# Patient Record
Sex: Male | Born: 1970 | Race: White | Hispanic: No | Marital: Married | Smoking: Current every day smoker
Health system: Southern US, Community
[De-identification: ages and names within clinical notes are randomized; demographics above are authoritative.]

## PROBLEM LIST (undated history)

## (undated) DIAGNOSIS — J449 Chronic obstructive pulmonary disease, unspecified: Secondary | ICD-10-CM

## (undated) DIAGNOSIS — R200 Anesthesia of skin: Secondary | ICD-10-CM

## (undated) DIAGNOSIS — E291 Testicular hypofunction: Secondary | ICD-10-CM

## (undated) DIAGNOSIS — M436 Torticollis: Secondary | ICD-10-CM

## (undated) DIAGNOSIS — M1990 Unspecified osteoarthritis, unspecified site: Secondary | ICD-10-CM

## (undated) DIAGNOSIS — Z974 Presence of external hearing-aid: Secondary | ICD-10-CM

## (undated) DIAGNOSIS — Z720 Tobacco use: Secondary | ICD-10-CM

## (undated) HISTORY — PX: BACK SURGERY: SHX140

## (undated) HISTORY — DX: Tobacco use: Z72.0

## (undated) HISTORY — DX: Testicular hypofunction: E29.1

## (undated) HISTORY — PX: SHOULDER SURGERY: SHX246

---

## 1998-01-24 HISTORY — PX: BACK SURGERY: SHX140

## 2004-06-02 ENCOUNTER — Other Ambulatory Visit: Payer: Self-pay

## 2004-06-02 ENCOUNTER — Emergency Department: Payer: Self-pay | Admitting: Emergency Medicine

## 2006-06-29 ENCOUNTER — Ambulatory Visit (HOSPITAL_COMMUNITY): Admission: RE | Admit: 2006-06-29 | Discharge: 2006-06-29 | Payer: Self-pay | Admitting: Specialist

## 2007-09-23 ENCOUNTER — Emergency Department: Payer: Self-pay | Admitting: Emergency Medicine

## 2008-09-05 ENCOUNTER — Ambulatory Visit (HOSPITAL_COMMUNITY): Admission: RE | Admit: 2008-09-05 | Discharge: 2008-09-05 | Payer: Self-pay | Admitting: Specialist

## 2009-12-12 IMAGING — CR DG ORBITS FOR FOREIGN BODY
2 series · 2 of 2 positions shown · non-contrast
Comparison: 06/29/2006

CLINICAL DATA: History of metal exposure to the eyes.  Pre MRI.

ORBITS FOR FOREIGN BODY - 2 VIEW

[w waters * (1 of 2)]
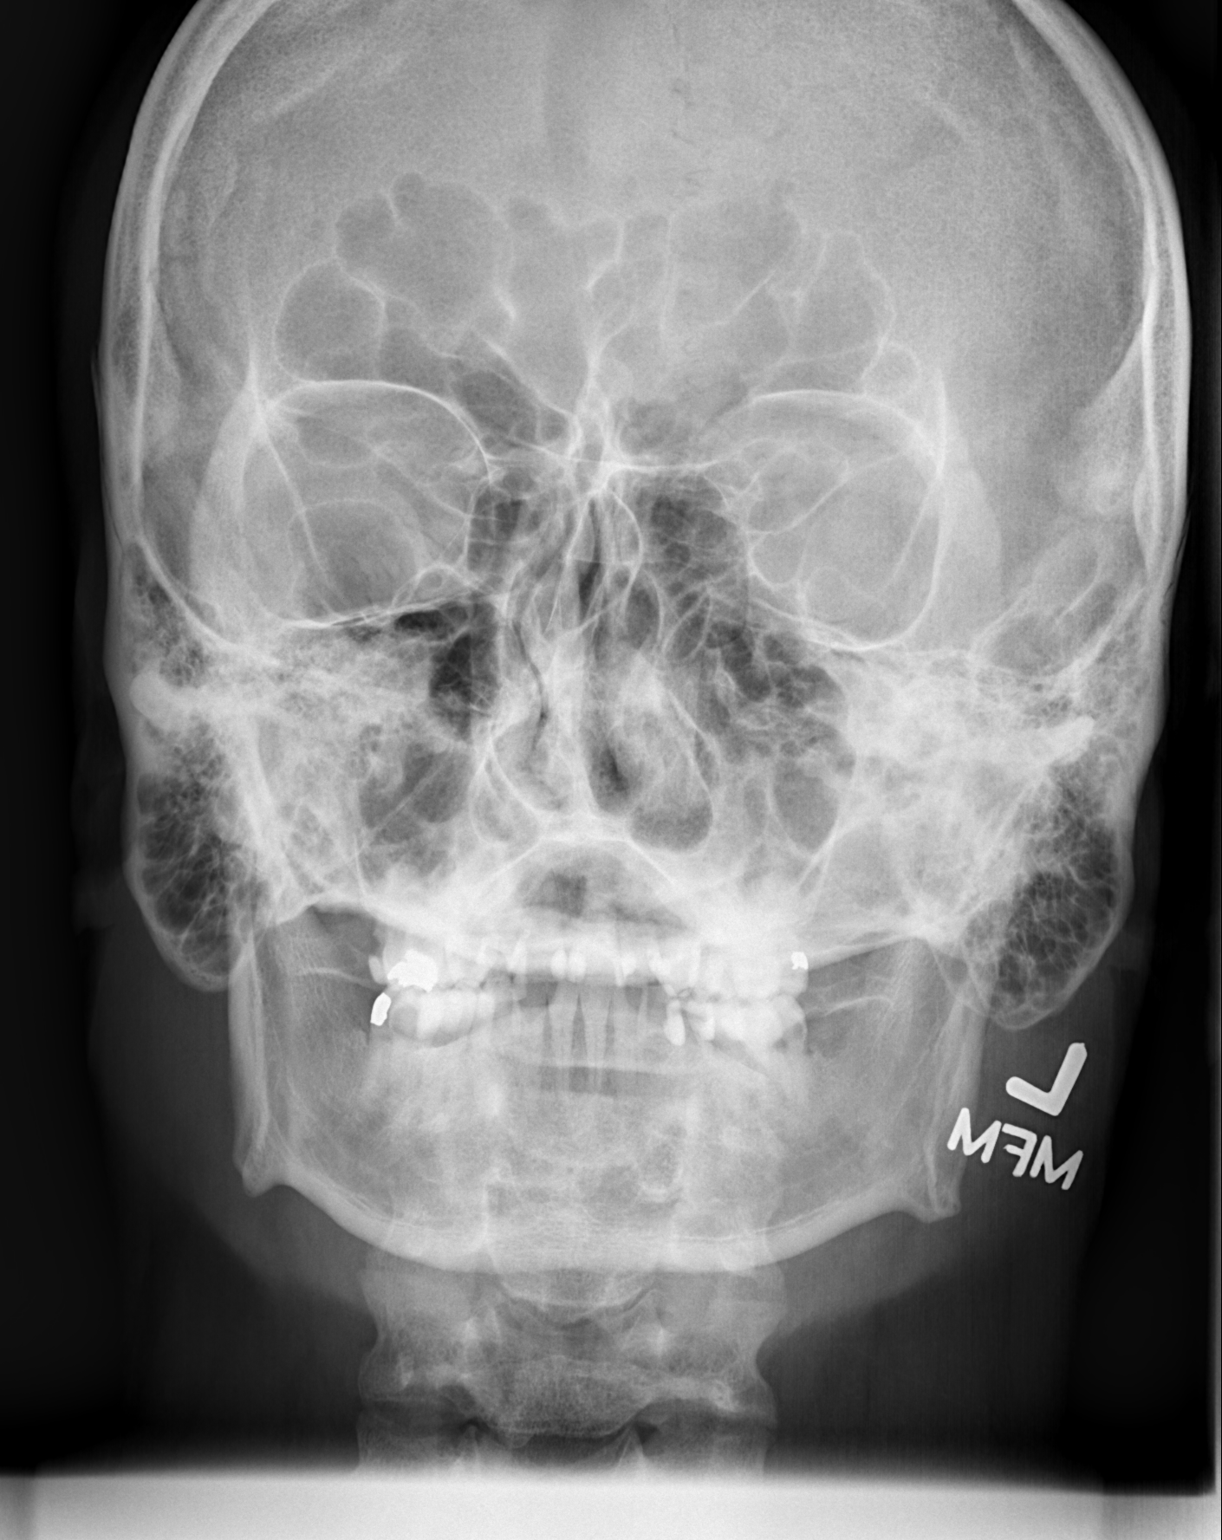

[w waters * (2 of 2)]
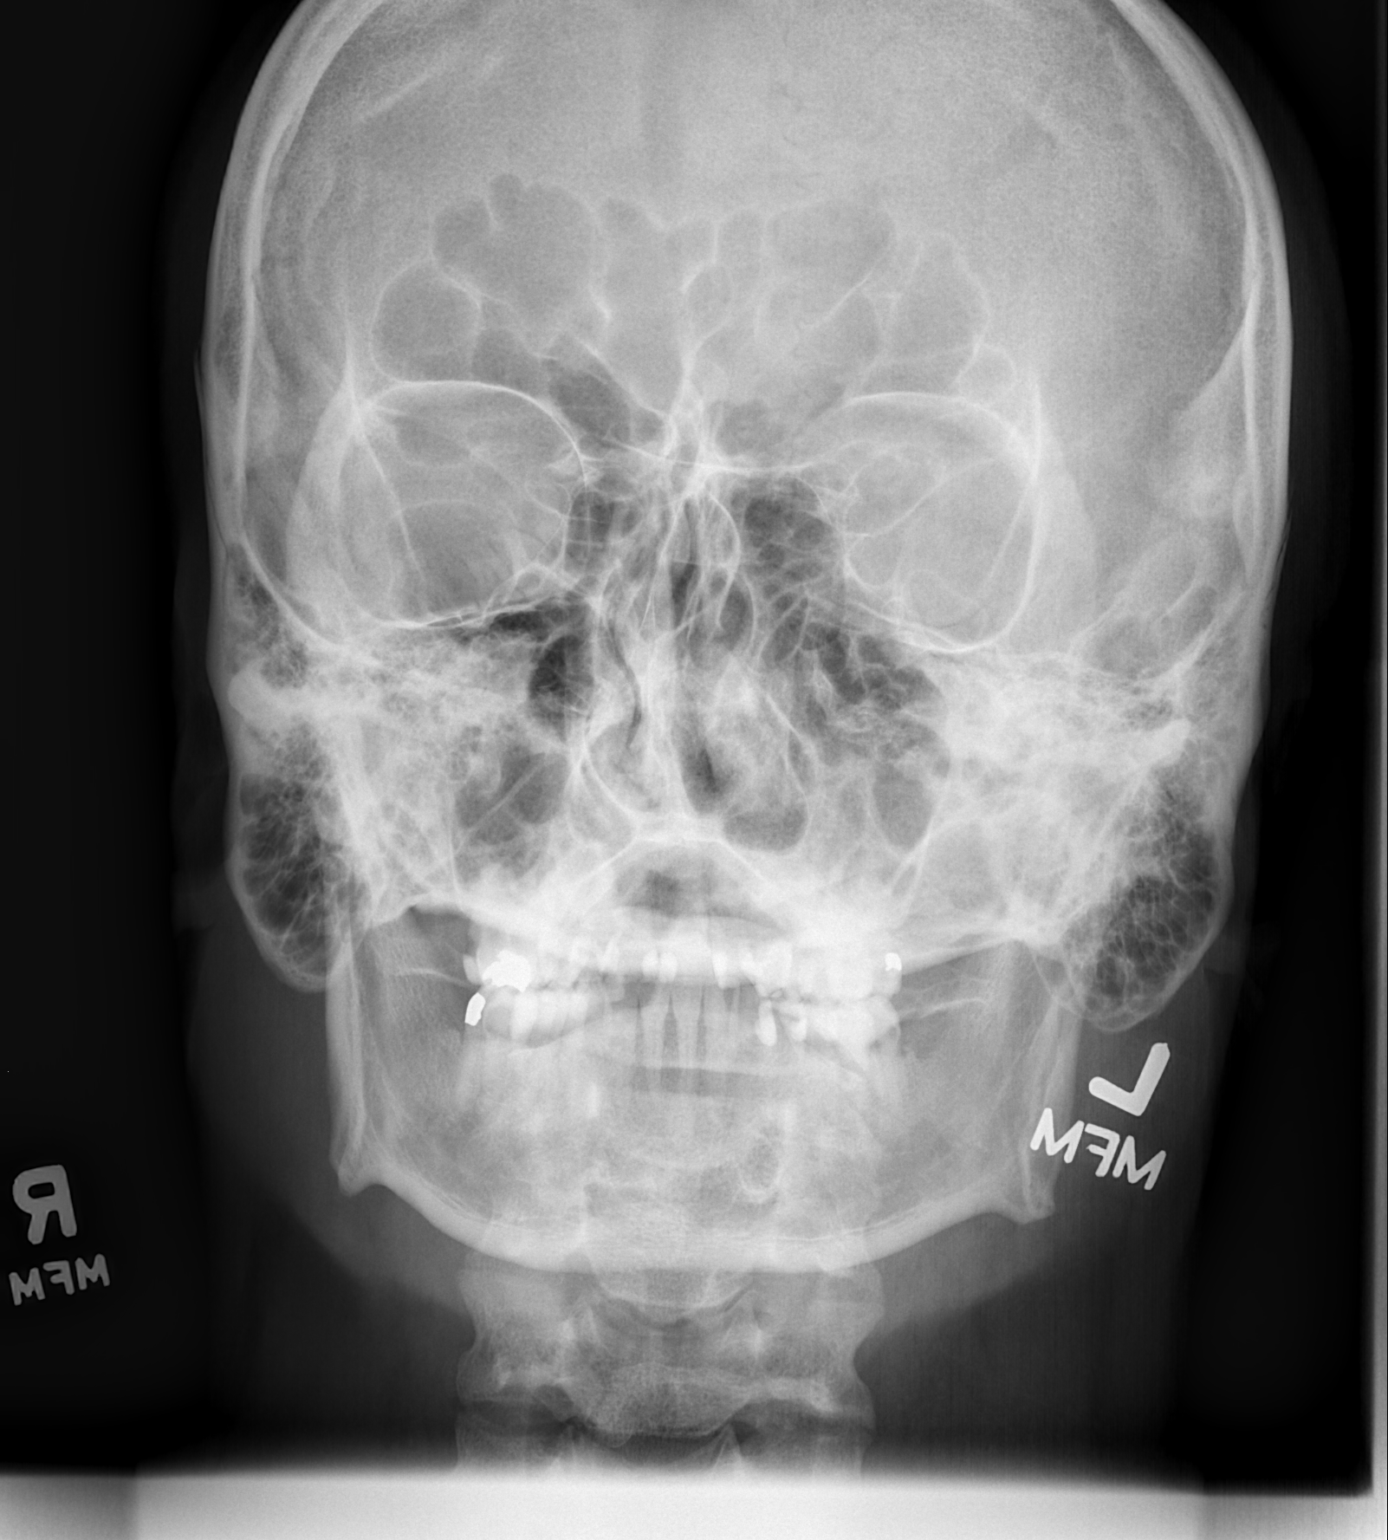

[2 of 2 positions shown; findings below may reference images not displayed]

FINDINGS: There is no radiodense foreign body in or around the
orbits.  The visualized bony structures are normal.
IMPRESSION: Normal exam.  No evidence of a metallic foreign body around the
orbits.

## 2014-07-11 ENCOUNTER — Ambulatory Visit (INDEPENDENT_AMBULATORY_CARE_PROVIDER_SITE_OTHER): Payer: 59 | Admitting: Family Medicine

## 2014-07-11 ENCOUNTER — Encounter: Payer: Self-pay | Admitting: Family Medicine

## 2014-07-11 VITALS — BP 123/87 | HR 89 | Temp 97.6°F | Wt 237.0 lb

## 2014-07-11 DIAGNOSIS — Z720 Tobacco use: Secondary | ICD-10-CM

## 2014-07-11 DIAGNOSIS — J40 Bronchitis, not specified as acute or chronic: Secondary | ICD-10-CM

## 2014-07-11 DIAGNOSIS — E291 Testicular hypofunction: Secondary | ICD-10-CM | POA: Insufficient documentation

## 2014-07-11 MED ORDER — ALBUTEROL SULFATE HFA 108 (90 BASE) MCG/ACT IN AERS: 2.0000 | INHALATION_SPRAY | RESPIRATORY_TRACT | Status: DC | PRN

## 2014-07-11 MED ORDER — DOXYCYCLINE HYCLATE 100 MG PO TABS: 100.0000 mg | ORAL_TABLET | Freq: Two times a day (BID) | ORAL | Status: DC

## 2014-07-11 MED ORDER — BENZONATATE 100 MG PO CAPS: 100.0000 mg | ORAL_CAPSULE | Freq: Three times a day (TID) | ORAL | Status: DC | PRN

## 2014-07-11 NOTE — Patient Instructions (Addendum)
Try vitamin C (orange juice if not diabetic or vitamin C tablets) and drink green tea to help your immune system during your illness Get plenty of rest and hydration Please do take a probiotic or eat yogurt daily for the next month If you develop watery, foul diarrhea in the next two months, schedule an appointment here right away to be evaluated Your goal blood pressure is less than 140/90 Try to follow the DASH guidelines (DASH stands for Dietary Approaches to Stop Hypertension) Try to limit the sodium in your diet.  Ideally, consume less than 1.5 grams (less than 1,500mg ) per day. Do not add salt when cooking or at the table.  Check the sodium amount on labels when shopping, and choose items lower in sodium when given a choice. Avoid or limit foods that already contain a lot of sodium. Eat a diet rich in fruits and vegetables and whole grains. Do try to quit smoking; it will be the BEST thing you do for your health and your wallet 1-800-QUIT-NOW has free counseling   DASH Eating Plan DASH stands for "Dietary Approaches to Stop Hypertension." The DASH eating plan is a healthy eating plan that has been shown to reduce high blood pressure (hypertension). Additional health benefits may include reducing the risk of type 2 diabetes mellitus, heart disease, and stroke. The DASH eating plan may also help with weight loss. WHAT DO I NEED TO KNOW ABOUT THE DASH EATING PLAN? For the DASH eating plan, you will follow these general guidelines:  Choose foods with a percent daily value for sodium of less than 5% (as listed on the food label).  Use salt-free seasonings or herbs instead of table salt or sea salt.  Check with your health care provider or pharmacist before using salt substitutes.  Eat lower-sodium products, often labeled as "lower sodium" or "no salt added."  Eat fresh foods.  Eat more vegetables, fruits, and low-fat dairy products.  Choose whole grains. Look for the word "whole" as the  first word in the ingredient list.  Choose fish and skinless chicken or [place] more often than red meat. Limit fish, poultry, and meat to 6 oz (170 g) each day.  Limit sweets, desserts, sugars, and sugary drinks.  Choose heart-healthy fats.  Limit cheese to 1 oz (28 g) per day.  Eat more home-cooked food and less restaurant, buffet, and fast food.  Limit fried foods.  Cook foods using methods other than frying.  Limit canned vegetables. If you do use them, rinse them well to decrease the sodium.  When eating at a restaurant, ask that your food be prepared with less salt, or no salt if possible. WHAT FOODS CAN I EAT? Seek help from a dietitian for individual calorie needs. Grains Whole grain or whole wheat bread. Brown rice. Whole grain or whole wheat pasta. Quinoa, bulgur, and whole grain cereals. Low-sodium cereals. Corn or whole wheat flour tortillas. Whole grain cornbread. Whole grain crackers. Low-sodium crackers. Vegetables Fresh or frozen vegetables (raw, steamed, roasted, or grilled). Low-sodium or reduced-sodium tomato and vegetable juices. Low-sodium or reduced-sodium tomato sauce and paste. Low-sodium or reduced-sodium canned vegetables.  Fruits All fresh, canned (in natural juice), or frozen fruits. Meat and Other Protein Products Ground beef (85% or leaner), grass-fed beef, or beef trimmed of fat. Skinless chicken or [place]. Ground chicken or [place]. Pork trimmed of fat. All fish and seafood. Eggs. Dried beans, peas, or lentils. Unsalted nuts and seeds. Unsalted canned beans. Dairy Low-fat dairy products, such as  skim or 1% milk, 2% or reduced-fat cheeses, low-fat ricotta or cottage cheese, or plain low-fat yogurt. Low-sodium or reduced-sodium cheeses. Fats and Oils Tub margarines without trans fats. Light or reduced-fat mayonnaise and salad dressings (reduced sodium). Avocado. Safflower, olive, or canola oils. Natural peanut or almond butter. Other Unsalted popcorn and  pretzels. The items listed above may not be a complete list of recommended foods or beverages. Contact your dietitian for more options. WHAT FOODS ARE NOT RECOMMENDED? Grains White bread. White pasta. White rice. Refined cornbread. Bagels and croissants. Crackers that contain trans fat. Vegetables Creamed or fried vegetables. Vegetables in a cheese sauce. Regular canned vegetables. Regular canned tomato sauce and paste. Regular tomato and vegetable juices. Fruits Dried fruits. Canned fruit in light or heavy syrup. Fruit juice. Meat and Other Protein Products Fatty cuts of meat. Ribs, chicken wings, bacon, sausage, bologna, salami, chitterlings, fatback, hot dogs, bratwurst, and packaged luncheon meats. Salted nuts and seeds. Canned beans with salt. Dairy Whole or 2% milk, cream, half-and-half, and cream cheese. Whole-fat or sweetened yogurt. Full-fat cheeses or blue cheese. Nondairy creamers and whipped toppings. Processed cheese, cheese spreads, or cheese curds. Condiments Onion and garlic salt, seasoned salt, table salt, and sea salt. Canned and packaged gravies. Worcestershire sauce. Tartar sauce. Barbecue sauce. Teriyaki sauce. Soy sauce, including reduced sodium. Steak sauce. Fish sauce. Oyster sauce. Cocktail sauce. Horseradish. Ketchup and mustard. Meat flavorings and tenderizers. Bouillon cubes. Hot sauce. Tabasco sauce. Marinades. Taco seasonings. Relishes. Fats and Oils Butter, stick margarine, lard, shortening, ghee, and bacon fat. Coconut, palm kernel, or palm oils. Regular salad dressings. Other Pickles and olives. Salted popcorn and pretzels. The items listed above may not be a complete list of foods and beverages to avoid. Contact your dietitian for more information. WHERE CAN I FIND MORE INFORMATION? National [employer], [employer], and [employer] Institute: [employer] Document Released: 12/30/2010 Document Revised: 05/27/2013 Document Reviewed:  11/14/2012 [facility] Patient Information 2015 [place], [place]. This information is not intended to replace advice given to you by your health care provider. Make sure you discuss any questions you have with your health care provider.   Nicotine Addiction Nicotine can act as both a stimulant (excites/activates) and a sedative (calms/quiets). Immediately after exposure to nicotine, there is a "kick" caused in part by the drug's stimulation of the adrenal glands and resulting discharge of adrenaline (epinephrine). The rush of adrenaline stimulates the body and causes a sudden release of sugar. This means that smokers are always slightly hyperglycemic. Hyperglycemic means that the blood sugar is high, just like in diabetics. Nicotine also decreases the amount of insulin which helps control sugar levels in the body. There is an increase in blood pressure, breathing, and the rate of heart beats.  In addition, nicotine indirectly causes a release of dopamine in the brain that controls pleasure and motivation. A similar reaction is seen with other drugs of abuse, such as cocaine and heroin. This dopamine release is thought to cause the pleasurable sensations when smoking. In some different cases, nicotine can also create a calming effect, depending on sensitivity of the smoker's nervous system and the dose of nicotine taken. WHAT HAPPENS WHEN NICOTINE IS TAKEN FOR LONG PERIODS OF TIME?  Long-term use of nicotine results in addiction. It is difficult to stop.  Repeated use of nicotine creates tolerance. Higher doses of nicotine are needed to get the "kick." When nicotine use is stopped, withdrawal may last a month or more. Withdrawal may begin within a few hours after the last  cigarette. Symptoms peak within the first few days and may lessen within a few weeks. For some people, however, symptoms may last for months or longer. Withdrawal symptoms include:   Irritability.  Craving.  Learning and attention  deficits.  Sleep disturbances.  Increased appetite. Craving for tobacco may last for 6 months or longer. Many behaviors done while using nicotine can also play a part in the severity of withdrawal symptoms. For some people, the feel, smell, and sight of a cigarette and the ritual of obtaining, handling, lighting, and smoking the cigarette are closely linked with the pleasure of smoking. When stopped, they also miss the related behaviors which make the withdrawal or craving worse. While nicotine gum and patches may lessen the drug aspects of withdrawal, cravings often persist. WHAT ARE THE MEDICAL CONSEQUENCES OF NICOTINE USE?  Nicotine addiction accounts for one-third of all cancers. The top cancer caused by tobacco is lung cancer. Lung cancer is the number one cancer killer of both men and women.  Smoking is also associated with cancers of the:  Mouth.  Pharynx.  Larynx.  Esophagus.  Stomach.  Pancreas.  Cervix.  Kidney.  Ureter.  Bladder.  Smoking also causes lung diseases such as lasting (chronic) bronchitis and emphysema.  It worsens asthma in adults and children.  Smoking increases the risk of heart disease, including:  Stroke.  Heart attack.  Vascular disease.  Aneurysm.  Passive or secondary smoke can also increase medical risks including:  Asthma in children.  Sudden Infant Death Syndrome (SIDS).  Additionally, dropped cigarettes are the leading cause of residential fire fatalities.  Nicotine poisoning has been reported from accidental ingestion of tobacco products by children and pets. Death usually results in a few minutes from respiratory failure (when a person stops breathing) caused by paralysis. TREATMENT   Medication. Nicotine replacement medicines such as nicotine gum and the patch are used to stop smoking. These medicines gradually lower the dosage of nicotine in the body. These medicines do not contain the carbon monoxide and other toxins  found in tobacco smoke.  Hypnotherapy.  Relaxation therapy.  Nicotine Anonymous (a 12-step support program). Find times and locations in your local yellow pages. Document Released: 09/16/2003 Document Revised: 04/04/2011 Document Reviewed: 03/08/2013 [facility] Patient Information 2015 [place], [place]. This information is not intended to replace advice given to you by your health care provider. Make sure you discuss any questions you have with your health care provider.

## 2014-07-11 NOTE — Assessment & Plan Note (Signed)
Encouraged cessation; discussed potential risks of vaping / e-cigs, bronchiolitis obliterans; don't just substitute one for the other; other options such as pills, education and QUIT-NOW number given; he has tried wellbutrin and did not like how that made him feel

## 2014-07-11 NOTE — Progress Notes (Signed)
BP 123/87 mmHg  Pulse 89  Temp(Src) 97.6 F (36.4 C)  Wt 237 lb (107.502 kg)  SpO2 95%   Subjective:    Patient ID: Luis Buck, male    DOB: 06/26/1970, 44 y.o.   MRN: [id number]  HPI: Luis Buck is a 44 y.o. male  Chief Complaint  Patient presents with  . URI   He started to have allergic symptoms, runny nose on Sun or Monday, then it developed into cough and into the chest Sinuses were bothering her some; ran through two rolls of toilet paper; some bloody drainage; little bit of sore throat; no big glands in his neck; no fevers, some chills; normally hot person Bringing up some phlegm, not much; feels different the stuff that comes up Nyquil and Dyquil He has a hx of bronchitis and pneumonia  He is a smoker; 1 to 1.5 ppd He gave up sodas; he has lost a little bit of weight; he knows he has more to go He is pleased with how his BP has come down  Relevant past medical, surgical, family and social history reviewed and updated as indicated. Interim medical history since our last visit reviewed. Allergies and medications reviewed and updated.  Review of Systems Per HPI unless specifically indicated above     Objective:    BP 123/87 mmHg  Pulse 89  Temp(Src) 97.6 F (36.4 C)  Wt 237 lb (107.502 kg)  SpO2 95%  Wt Readings from Last 3 Encounters:  07/11/14 237 lb (107.502 kg)  02/28/14 245 lb (111.131 kg)    Physical Exam  Constitutional: He appears well-developed and well-nourished. No distress.  HENT:  Head: Normocephalic and atraumatic.  Right Ear: Tympanic membrane, external ear and ear canal normal. Tympanic membrane is not erythematous.  Left Ear: Tympanic membrane, external ear and ear canal normal. Tympanic membrane is not erythematous.  Nose: No rhinorrhea. No epistaxis. Right sinus exhibits no maxillary sinus tenderness and no frontal sinus tenderness. Left sinus exhibits no maxillary sinus tenderness and no frontal sinus tenderness.   Mouth/Throat: No oropharyngeal exudate.  Eyes: Right eye exhibits no discharge. Left eye exhibits no discharge. Right conjunctiva is not injected. Left conjunctiva is not injected. No scleral icterus.  Cardiovascular: Normal rate and regular rhythm.   Pulmonary/Chest: No accessory muscle usage. No tachypnea. No respiratory distress. He has wheezes (faint expiratory wheezes). He has no rhonchi. He has no rales.  Lymphadenopathy:       Head (right side): No submandibular adenopathy present.       Head (left side): No submandibular adenopathy present.    He has no cervical adenopathy.  Skin: No rash noted. He is not diaphoretic. No cyanosis. No pallor. Nails show no clubbing.  Psychiatric: His speech is normal and behavior is normal. Judgment and thought content normal. His mood appears not anxious. He does not exhibit a depressed mood.   No results found for this or any previous visit.    Assessment & Plan:   Problem List Items Addressed This Visit      Other   Tobacco abuse    Encouraged cessation; discussed potential risks of vaping / e-cigs, bronchiolitis obliterans; don't just substitute one for the other; other options such as pills, education and QUIT-NOW number given; he has tried wellbutrin and did not like how that made him feel       Other Visit Diagnoses    Bronchitis    -  Primary    acute bronchitis, suspect  bacterial cause, smoker; start doxy; will also give SABA to use PRN and tessalon perles PRN       DASH guidelines given Encouraged him for giving up sodas Return for physical  Follow up plan: Return if symptoms worsen or fail to improve.

## 2014-07-17 ENCOUNTER — Encounter: Payer: Self-pay | Admitting: Family Medicine

## 2014-07-17 ENCOUNTER — Ambulatory Visit (INDEPENDENT_AMBULATORY_CARE_PROVIDER_SITE_OTHER): Payer: 59 | Admitting: Family Medicine

## 2014-07-17 VITALS — BP 125/85 | HR 78 | Temp 98.4°F | Ht 70.0 in | Wt 237.8 lb

## 2014-07-17 DIAGNOSIS — Z23 Encounter for immunization: Secondary | ICD-10-CM | POA: Diagnosis not present

## 2014-07-17 DIAGNOSIS — N5201 Erectile dysfunction due to arterial insufficiency: Secondary | ICD-10-CM

## 2014-07-17 DIAGNOSIS — Z0000 Encounter for general adult medical examination without abnormal findings: Secondary | ICD-10-CM | POA: Diagnosis not present

## 2014-07-17 DIAGNOSIS — Z720 Tobacco use: Secondary | ICD-10-CM

## 2014-07-17 DIAGNOSIS — R351 Nocturia: Secondary | ICD-10-CM | POA: Diagnosis not present

## 2014-07-17 LAB — URINALYSIS, ROUTINE W REFLEX MICROSCOPIC
BILIRUBIN UA: NEGATIVE
GLUCOSE, UA: NEGATIVE
LEUKOCYTES UA: NEGATIVE
Nitrite, UA: NEGATIVE
PROTEIN UA: NEGATIVE
RBC UA: NEGATIVE
SPEC GRAV UA: 1.025 (ref 1.005–1.030)
Urobilinogen, Ur: 0.2 mg/dL (ref 0.2–1.0)
pH, UA: 6 (ref 5.0–7.5)

## 2014-07-17 MED ORDER — SILDENAFIL CITRATE 50 MG PO TABS: 50.0000 mg | ORAL_TABLET | Freq: Every day | ORAL | Status: DC | PRN

## 2014-07-17 NOTE — Progress Notes (Signed)
BP 125/85 mmHg  Pulse 78  Temp(Src) 98.4 F (36.9 C) (Oral)  Ht 5\' 10"  (1.778 m)  Wt 237 lb 12.8 oz (107.865 kg)  BMI 34.12 kg/m2  SpO2 95%   Subjective:    Patient ID: Luis Buck, male    DOB: 02/19/1970, 44 y.o.   MRN: [id number]  HPI: Luis Buck is a 44 y.o. male  Chief Complaint  Patient presents with  . Annual Exam    Tired.   He is here for his complete physical He doesn't have the energy he used to have any more The sexual drive is still there and very healthy He has lost some muscle bulk; some diminished muscle strength; no change in hair, no change in voice Vision is getting worse, should be wear glasses  Cough is better Smoking 1 ppd; he is interested in quitting; he tried Wellbutrin, but it made him feel weird; did not like how he felt; Chantix gave him no success, did nothing  Relevant past medical, surgical, family and social history reviewed and updated as indicated. Interim medical history since our last visit reviewed. Allergies and medications reviewed and updated.  Review of Systems  Constitutional: Positive for fatigue.  Genitourinary: Positive for urgency. Negative for dysuria, hematuria, decreased urine volume, discharge, penile swelling, scrotal swelling, enuresis, difficulty urinating, penile pain and testicular pain.       Nocturia x 2   Per HPI unless specifically indicated above    Objective:    BP 125/85 mmHg  Pulse 78  Temp(Src) 98.4 F (36.9 C) (Oral)  Ht 5\' 10"  (1.778 m)  Wt 237 lb 12.8 oz (107.865 kg)  BMI 34.12 kg/m2  SpO2 95%  Wt Readings from Last 3 Encounters:  07/17/14 237 lb 12.8 oz (107.865 kg)  07/11/14 237 lb (107.502 kg)  02/28/14 245 lb (111.131 kg)    Physical Exam  Constitutional: He appears well-developed and well-nourished. No distress.  HENT:  Head: Normocephalic and atraumatic.  Nose: No rhinorrhea.  Mouth/Throat: Mucous membranes are normal. No oropharyngeal exudate.  Eyes: EOM are normal. No  scleral icterus.  Neck: No JVD present. No thyromegaly present.  Cardiovascular: Normal rate, regular rhythm and normal heart sounds.   No murmur heard. Pulmonary/Chest: Effort normal and breath sounds normal. No respiratory distress. He has no wheezes.  Abdominal: Soft. Bowel sounds are normal. He exhibits no distension. There is no tenderness. There is no guarding.  Genitourinary:  Mutual decision making, deferred  Musculoskeletal: He exhibits no edema.  Lymphadenopathy:    He has no cervical adenopathy.  Neurological: He is alert. He displays no tremor. He exhibits normal muscle tone.  Skin: Skin is warm and dry. No rash noted. He is not diaphoretic. No cyanosis.  Psychiatric: He has a normal mood and affect. His behavior is normal. Judgment and thought content normal.  Nursing note and vitals reviewed.      Assessment & Plan:   Problem List Items Addressed This Visit      Other   Tobacco abuse    Stressed importance of quitting smoking; he did not do well with previous meds; QUITNOW; explained link between smoking and ED       Other Visit Diagnoses    Preventative health care    -  Primary    Relevant Orders    Comprehensive metabolic panel (Completed)    CBC with Differential/Platelet (Completed)    Lipid Panel w/o Chol/HDL Ratio (Completed)    PSA, total and free (  Completed)    Erectile dysfunction due to arterial insufficiency        explained more likely due to smoking, vascular problems than testosterone; Rx for viagra; discussed med    Relevant Orders    EKG 12-Lead    Testosterone, free, total (Completed)    Nocturia        check urine, check glucose    Relevant Orders    Urinalysis, Routine w reflex microscopic (not at [facility]) (Completed)    Immunization due        tetanus-diphtheria-pertussis; next plain tetanus-diphtheria due in 10 years; pertussis should provide life-long immunity    Relevant Orders    Tdap vaccine greater than or equal to 7yo IM  (Completed)       Follow up plan: Return in about 1 year (around 07/17/2015) for physical.  Meds ordered this encounter  Medications  . sildenafil (VIAGRA) 50 MG tablet    Sig: Take 1 tablet (50 mg total) by mouth daily as needed for erectile dysfunction.    Dispense:  10 tablet    Refill:  5    An After Visit Summary was printed and given to the patient.

## 2014-07-17 NOTE — Patient Instructions (Addendum)
Smoking Cessation, Tips for Success If you are ready to quit smoking, congratulations! You have chosen to help yourself be healthier. Cigarettes bring nicotine, tar, carbon monoxide, and other irritants into your body. Your lungs, heart, and blood vessels will be able to work better without these poisons. There are many different ways to quit smoking. Nicotine gum, nicotine patches, a nicotine inhaler, or nicotine nasal spray can help with physical craving. Hypnosis, support groups, and medicines help break the habit of smoking. WHAT THINGS CAN I DO TO MAKE QUITTING EASIER?  Here are some tips to help you quit for good:  Pick a date when you will quit smoking completely. Tell all of your friends and family about your plan to quit on that date.  Do not try to slowly cut down on the number of cigarettes you are smoking. Pick a quit date and quit smoking completely starting on that day.  Throw away all cigarettes.   Clean and remove all ashtrays from your home, work, and car.  On a card, write down your reasons for quitting. Carry the card with you and read it when you get the urge to smoke.  Cleanse your body of nicotine. Drink enough water and fluids to keep your urine clear or pale yellow. Do this after quitting to flush the nicotine from your body.  Learn to predict your moods. Do not let a bad situation be your excuse to have a cigarette. Some situations in your life might tempt you into wanting a cigarette.  Never have "just one" cigarette. It leads to wanting another and another. Remind yourself of your decision to quit.  Change habits associated with smoking. If you smoked while driving or when feeling stressed, try other activities to replace smoking. Stand up when drinking your coffee. Brush your teeth after eating. Sit in a different chair when you read the paper. Avoid alcohol while trying to quit, and try to drink fewer caffeinated beverages. Alcohol and caffeine may urge you to  smoke.  Avoid foods and drinks that can trigger a desire to smoke, such as sugary or spicy foods and alcohol.  Ask people who smoke not to smoke around you.  Have something planned to do right after eating or having a cup of coffee. For example, plan to take a walk or exercise.  Try a relaxation exercise to calm you down and decrease your stress. Remember, you may be tense and nervous for the first 2 weeks after you quit, but this will pass.  Find new activities to keep your hands busy. Play with a pen, coin, or rubber band. Doodle or draw things on paper.  Brush your teeth right after eating. This will help cut down on the craving for the taste of tobacco after meals. You can also try mouthwash.   Use oral substitutes in place of cigarettes. Try using lemon drops, carrots, cinnamon sticks, or chewing gum. Keep them handy so they are available when you have the urge to smoke.  When you have the urge to smoke, try deep breathing.  Designate your home as a nonsmoking area.  If you are a heavy smoker, ask your health care provider about a prescription for nicotine chewing gum. It can ease your withdrawal from nicotine.  Reward yourself. Set aside the cigarette money you save and buy yourself something nice.  Look for support from others. Join a support group or smoking cessation program. Ask someone at home or at work to help you with your plan   to quit smoking.  Always ask yourself, "Do I need this cigarette or is this just a reflex?" Tell yourself, "Today, I choose not to smoke," or "I do not want to smoke." You are reminding yourself of your decision to quit.  Do not replace cigarette smoking with electronic cigarettes (commonly called e-cigarettes). The safety of e-cigarettes is unknown, and some may contain harmful chemicals.  If you relapse, do not give up! Plan ahead and think about what you will do the next time you get the urge to smoke. HOW WILL I FEEL WHEN I QUIT SMOKING? You  may have symptoms of withdrawal because your body is used to nicotine (the addictive substance in cigarettes). You may crave cigarettes, be irritable, feel very hungry, cough often, get headaches, or have difficulty concentrating. The withdrawal symptoms are only temporary. They are strongest when you first quit but will go away within 10-14 days. When withdrawal symptoms occur, stay in control. Think about your reasons for quitting. Remind yourself that these are signs that your body is healing and getting used to being without cigarettes. Remember that withdrawal symptoms are easier to treat than the major diseases that smoking can cause.  Even after the withdrawal is over, expect periodic urges to smoke. However, these cravings are generally short lived and will go away whether you smoke or not. Do not smoke! WHAT RESOURCES ARE AVAILABLE TO HELP ME QUIT SMOKING? Your health care provider can direct you to community resources or hospitals for support, which may include:  Group support.  Education.  Hypnosis.  Therapy. Document Released: 10/09/2003 Document Revised: 05/27/2013 Document Reviewed: 06/28/2012 [facility] Patient Information 2015 [place], [place]. This information is not intended to replace advice given to you by your health care provider. Make sure you discuss any questions you have with your health care provider.    Health Maintenance A healthy lifestyle and preventative care can promote health and wellness.  Maintain regular health, dental, and eye exams.  Eat a healthy diet. Foods like vegetables, fruits, whole grains, low-fat dairy products, and lean protein foods contain the nutrients you need and are low in calories. Decrease your intake of foods high in solid fats, added sugars, and salt. Get information about a proper diet from your health care provider, if necessary.  Regular physical exercise is one of the most important things you can do for your health. Most adults  should get at least 150 minutes of moderate-intensity exercise (any activity that increases your heart rate and causes you to sweat) each week. In addition, most adults need muscle-strengthening exercises on 2 or more days a week.   Maintain a healthy weight. The body mass index (BMI) is a screening tool to identify possible weight problems. It provides an estimate of body fat based on height and weight. Your health care provider can find your BMI and can help you achieve or maintain a healthy weight. For males 20 years and older:  A BMI below 18.5 is considered underweight.  A BMI of 18.5 to 24.9 is normal.  A BMI of 25 to 29.9 is considered overweight.  A BMI of 30 and above is considered obese.  Maintain normal blood lipids and cholesterol by exercising and minimizing your intake of saturated fat. Eat a balanced diet with plenty of fruits and vegetables. Blood tests for lipids and cholesterol should begin at age 74 and be repeated every 5 years. If your lipid or cholesterol levels are high, you are over age 33, or you are  at high risk for heart disease, you may need your cholesterol levels checked more frequently.Ongoing high lipid and cholesterol levels should be treated with medicines if diet and exercise are not working.  If you smoke, find out from your health care provider how to quit. If you do not use tobacco, do not start.  Lung cancer screening is recommended for adults aged 55-80 years who are at high risk for developing lung cancer because of a history of smoking. A yearly low-dose CT scan of the lungs is recommended for people who have at least a 30-pack-year history of smoking and are current smokers or have quit within the past 15 years. A pack year of smoking is smoking an average of 1 pack of cigarettes a day for 1 year (for example, a 30-pack-year history of smoking could mean smoking 1 pack a day for 30 years or 2 packs a day for 15 years). Yearly screening should continue  until the smoker has stopped smoking for at least 15 years. Yearly screening should be stopped for people who develop a health problem that would prevent them from having lung cancer treatment.  If you choose to drink alcohol, do not have more than 2 drinks per day. One drink is considered to be 12 oz (360 mL) of beer, 5 oz (150 mL) of wine, or 1.5 oz (45 mL) of liquor.  Avoid the use of street drugs. Do not share needles with anyone. Ask for help if you need support or instructions about stopping the use of drugs.  High blood pressure causes heart disease and increases the risk of stroke. Blood pressure should be checked at least every 1-2 years. Ongoing high blood pressure should be treated with medicines if weight loss and exercise are not effective.  If you are 58-10 years old, ask your health care provider if you should take aspirin to prevent heart disease.  Diabetes screening involves taking a blood sample to check your fasting blood sugar level. This should be done once every 3 years after age 61 if you are at a normal weight and without risk factors for diabetes. Testing should be considered at a younger age or be carried out more frequently if you are overweight and have at least 1 risk factor for diabetes.  Colorectal cancer can be detected and often prevented. Most routine colorectal cancer screening begins at the age of 53 and continues through age 60. However, your health care provider may recommend screening at an earlier age if you have risk factors for colon cancer. On a yearly basis, your health care provider may provide home test kits to check for hidden blood in the stool. A small camera at the end of a tube may be used to directly examine the colon (sigmoidoscopy or colonoscopy) to detect the earliest forms of colorectal cancer. Talk to your health care provider about this at age 53 when routine screening begins. A direct exam of the colon should be repeated every 5-10 years through  age 11, unless early forms of precancerous polyps or small growths are found.  People who are at an increased risk for hepatitis B should be screened for this virus. You are considered at high risk for hepatitis B if:  You were born in a country where hepatitis B occurs often. Talk with your health care provider about which countries are considered high risk.  Your parents were born in a high-risk country and you have not received a shot to protect against hepatitis B (  hepatitis B vaccine).  You have HIV or AIDS.  You use needles to inject street drugs.  You live with, or have sex with, someone who has hepatitis B.  You are a man who has sex with other men (MSM).  You get hemodialysis treatment.  You take certain medicines for conditions like cancer, organ transplantation, and autoimmune conditions.  Hepatitis C blood testing is recommended for all people born from [birth year] through 1965 and any individual with known risk factors for hepatitis C.  Healthy men should no longer receive prostate-specific antigen (PSA) blood tests as part of routine cancer screening. Talk to your health care provider about prostate cancer screening.  Testicular cancer screening is not recommended for adolescents or adult males who have no symptoms. Screening includes self-exam, a health care provider exam, and other screening tests. Consult with your health care provider about any symptoms you have or any concerns you have about testicular cancer.  Practice safe sex. Use condoms and avoid high-risk sexual practices to reduce the spread of sexually transmitted infections (STIs).  You should be screened for STIs, including gonorrhea and chlamydia if:  You are sexually active and are younger than 24 years.  You are older than 24 years, and your health care provider tells you that you are at risk for this type of infection.  Your sexual activity has changed since you were last screened, and you are at an  increased risk for chlamydia or gonorrhea. Ask your health care provider if you are at risk.  If you are at risk of being infected with HIV, it is recommended that you take a prescription medicine daily to prevent HIV infection. This is called pre-exposure prophylaxis (PrEP). You are considered at risk if:  You are a man who has sex with other men (MSM).  You are a heterosexual man who is sexually active with multiple partners.  You take drugs by injection.  You are sexually active with a partner who has HIV.  Talk with your health care provider about whether you are at high risk of being infected with HIV. If you choose to begin PrEP, you should first be tested for HIV. You should then be tested every 3 months for as long as you are taking PrEP.  Use sunscreen. Apply sunscreen liberally and repeatedly throughout the day. You should seek shade when your shadow is shorter than you. Protect yourself by wearing long sleeves, pants, a wide-brimmed hat, and sunglasses year round whenever you are outdoors.  Tell your health care provider of new moles or changes in moles, especially if there is a change in shape or color. Also, tell your health care provider if a mole is larger than the size of a pencil eraser.  A one-time screening for abdominal aortic aneurysm (AAA) and surgical repair of large AAAs by ultrasound is recommended for men aged 65-75 years who are current or former smokers.  Stay current with your vaccines (immunizations). Document Released: 07/09/2007 Document Revised: 01/15/2013 Document Reviewed: 06/07/2010 [facility] Patient Information 2015 [place], [place]. This information is not intended to replace advice given to you by your health care provider. Make sure you discuss any questions you have with your health care provider.  ----------------- Stay hydrated Try turmeric as a natural anti-inflammatory (for pain and arthritis). It comes in capsules where you buy aspirin and fish  oil, but also as a spice where you buy pepper and garlic powder. Tylenol per package directions Topical ice and/or arthritis rubs biofreeze are also  helpful

## 2014-07-18 ENCOUNTER — Telehealth: Payer: Self-pay | Admitting: Family Medicine

## 2014-07-18 LAB — CBC WITH DIFFERENTIAL/PLATELET
BASOS: 0 %
Basophils Absolute: 0 10*3/uL (ref 0.0–0.2)
EOS (ABSOLUTE): 0.3 10*3/uL (ref 0.0–0.4)
EOS: 3 %
HEMOGLOBIN: 15.6 g/dL (ref 12.6–17.7)
Hematocrit: 44.5 % (ref 37.5–51.0)
IMMATURE GRANS (ABS): 0 10*3/uL (ref 0.0–0.1)
Immature Granulocytes: 0 %
Lymphocytes Absolute: 3.5 10*3/uL — ABNORMAL HIGH (ref 0.7–3.1)
Lymphs: 32 %
MCH: 31 pg (ref 26.6–33.0)
MCHC: 35.1 g/dL (ref 31.5–35.7)
MCV: 89 fL (ref 79–97)
MONOS ABS: 0.8 10*3/uL (ref 0.1–0.9)
Monocytes: 7 %
NEUTROS ABS: 6.4 10*3/uL (ref 1.4–7.0)
NEUTROS PCT: 58 %
Platelets: 253 10*3/uL (ref 150–379)
RBC: 5.03 x10E6/uL (ref 4.14–5.80)
RDW: 13.9 % (ref 12.3–15.4)
WBC: 11.1 10*3/uL — ABNORMAL HIGH (ref 3.4–10.8)

## 2014-07-18 LAB — COMPREHENSIVE METABOLIC PANEL
ALT: 31 IU/L (ref 0–44)
AST: 27 IU/L (ref 0–40)
Albumin/Globulin Ratio: 1.6 (ref 1.1–2.5)
Albumin: 4.2 g/dL (ref 3.5–5.5)
Alkaline Phosphatase: 77 IU/L (ref 39–117)
BUN/Creatinine Ratio: 12 (ref 9–20)
BUN: 11 mg/dL (ref 6–24)
Bilirubin Total: 0.5 mg/dL (ref 0.0–1.2)
CALCIUM: 9.4 mg/dL (ref 8.7–10.2)
CHLORIDE: 102 mmol/L (ref 97–108)
CO2: 22 mmol/L (ref 18–29)
Creatinine, Ser: 0.89 mg/dL (ref 0.76–1.27)
GFR calc Af Amer: 120 mL/min/{1.73_m2} (ref >59)
GFR calc non Af Amer: 104 mL/min/{1.73_m2} (ref >59)
GLOBULIN, TOTAL: 2.6 g/dL (ref 1.5–4.5)
GLUCOSE: 84 mg/dL (ref 65–99)
POTASSIUM: 4.5 mmol/L (ref 3.5–5.2)
SODIUM: 142 mmol/L (ref 134–144)
Total Protein: 6.8 g/dL (ref 6.0–8.5)

## 2014-07-18 LAB — PSA, TOTAL AND FREE
PROSTATE SPECIFIC AG, SERUM: 0.4 ng/mL (ref 0.0–4.0)
PSA, Free Pct: 40 %
PSA, Free: 0.16 ng/mL

## 2014-07-18 LAB — LIPID PANEL W/O CHOL/HDL RATIO
Cholesterol, Total: 218 mg/dL — ABNORMAL HIGH (ref 100–199)
HDL: 23 mg/dL — ABNORMAL LOW (ref >39)
Triglycerides: 412 mg/dL — ABNORMAL HIGH (ref 0–149)

## 2014-07-18 LAB — TESTOSTERONE, FREE, TOTAL, SHBG: Testosterone, Free: 7.2 pg/mL (ref 6.8–21.5)

## 2014-07-18 NOTE — Telephone Encounter (Signed)
Routing to provider  

## 2014-07-18 NOTE — Telephone Encounter (Signed)
Pt came in stating that the medication prescribed yesterday was too expensive and would like to instead have sildenafil 20 mg. Send to [facility] [address] and pt number is [phone]

## 2014-07-18 NOTE — Assessment & Plan Note (Signed)
Stressed importance of quitting smoking; he did not do well with previous meds; QUITNOW; explained link between smoking and ED

## 2014-07-21 ENCOUNTER — Telehealth: Payer: Self-pay | Admitting: Family Medicine

## 2014-07-21 MED ORDER — SILDENAFIL CITRATE 20 MG PO TABS: 40.0000 mg | ORAL_TABLET | Freq: Every day | ORAL | Status: DC | PRN

## 2014-07-21 NOTE — Telephone Encounter (Signed)
Duplicate request. Prior message still pending.

## 2014-07-21 NOTE — Telephone Encounter (Signed)
Pt called stated he needs his Viagra changed to a generic version for insurance to cover it. Pharm is [facility] [address] Thanks. Previous message sent last Friday.

## 2020-02-06 ENCOUNTER — Other Ambulatory Visit: Payer: Self-pay

## 2020-02-10 ENCOUNTER — Other Ambulatory Visit: Payer: Self-pay

## 2021-08-27 ENCOUNTER — Ambulatory Visit: Payer: Self-pay | Admitting: *Deleted

## 2021-08-27 NOTE — Telephone Encounter (Signed)
  Chief Complaint: SOB- moderate Symptoms: fatigue, SOB- moderate Frequency: started yesterday Pertinent Negatives: Patient denies fevr Disposition: [x] ED /[] Urgent Care (no appt availability in office) / [] Appointment(In office/virtual)/ []  [facility]/ [] Home Care/ [] Refused Recommended Disposition /[]  Mobile Bus/ []  Follow-up with PCP Additional Notes: Advised patient be seen today- ED recommended- patient advised be seen today- if not ED then UC- advised UC may send to ED

## 2021-08-27 NOTE — Telephone Encounter (Signed)
Reason for Disposition  [1] MODERATE difficulty breathing (e.g., speaks in phrases, SOB even at rest, pulse 100-120) AND [2] NEW-onset or WORSE than normal  Answer Assessment - Initial Assessment Questions 1. RESPIRATORY STATUS: "Describe your breathing?" (e.g., wheezing, shortness of breath, unable to speak, severe coughing)      Winded with exertion, fatigue 2. ONSET: "When did this breathing problem begin?"      yesterday 3. PATTERN "Does the difficult breathing come and go, or has it been constant since it started?"      constant 4. SEVERITY: "How bad is your breathing?" (e.g., mild, moderate, severe)    - MILD: No SOB at rest, mild SOB with walking, speaks normally in sentences, can lie down, no retractions, pulse < 100.    - MODERATE: SOB at rest, SOB with minimal exertion and prefers to sit, cannot lie down flat, speaks in phrases, mild retractions, audible wheezing, pulse 100-120.    - SEVERE: Very SOB at rest, speaks in single words, struggling to breathe, sitting hunched forward, retractions, pulse > 120      moderate 5. RECURRENT SYMPTOM: "Have you had difficulty breathing before?" If Yes, ask: "When was the last time?" and "What happened that time?"      no 6. CARDIAC HISTORY: "Do you have any history of heart disease?" (e.g., heart attack, angina, bypass surgery, angioplasty)      no 7. LUNG HISTORY: "Do you have any history of lung disease?"  (e.g., pulmonary embolus, asthma, emphysema)     Childhood bronchitis 8. CAUSE: "What do you think is causing the breathing problem?"      unsure 9. OTHER SYMPTOMS: "Do you have any other symptoms? (e.g., dizziness, runny nose, cough, chest pain, fever)     Fatigue, congestion 10. O2 SATURATION MONITOR:  "Do you use an oxygen saturation monitor (pulse oximeter) at home?" If Yes, ask: "What is your reading (oxygen level) today?" "What is your usual oxygen saturation reading?" (e.g., 95%)         11. PREGNANCY: "Is there any chance you are  pregnant?" "When was your last menstrual period?"         12. TRAVEL: "Have you traveled out of the country in the last month?" (e.g., travel history, exposures)       no  Protocols used: Breathing Difficulty-A-AH

## 2021-11-10 NOTE — Progress Notes (Unsigned)
There were no vitals taken for this visit.   Subjective:    Patient ID: Luis Buck, male    DOB: [birth year], 51 y.o.   MRN: [id number]  HPI: Luis Buck is a 51 y.o. male  No chief complaint on file.  Patient presents to clinic to establish care with new PCP.  Introduced to Designer, [employer]ellery role and practice setting.  All questions answered.  Discussed provider/patient relationship and expectations.  Patient reports a history of ***. Patient denies a history of: Hypertension, Elevated Cholesterol, Diabetes, Thyroid problems, Depression, Anxiety, Neurological problems, and Abdominal problems.   Active Ambulatory Problems    Diagnosis Date Noted   Hypogonadism in male    Tobacco abuse    Resolved Ambulatory Problems    Diagnosis Date Noted   No Resolved Ambulatory Problems   No Additional Past Medical History   Past Surgical History:  Procedure Laterality Date   BACK SURGERY     C6-7 fused   SHOULDER SURGERY     "cleaned out"   Family History  Problem Relation Age of Onset   Cancer Mother        lung, brain   Hypertension Father    Heart disease Maternal Grandfather      Review of Systems  Per HPI unless specifically indicated above     Objective:    There were no vitals taken for this visit.  Wt Readings from Last 3 Encounters:  07/17/14 237 lb 12.8 oz (107.9 kg)  07/11/14 237 lb (107.5 kg)  02/28/14 245 lb (111.1 kg)    Physical Exam  Results for orders placed or performed in visit on 07/17/14  Comprehensive metabolic panel  Result Value Ref Range   Glucose 84 65 - 99 mg/dL   BUN 11 6 - 24 mg/dL   Creatinine, Ser 0.89 0.76 - 1.27 mg/dL   GFR calc non Af Amer 104 >59 mL/min/1.73   GFR calc Af Amer 120 >59 mL/min/1.73   BUN/Creatinine Ratio 12 9 - 20   Sodium 142 134 - 144 mmol/L   Potassium 4.5 3.5 - 5.2 mmol/L   Chloride 102 97 - 108 mmol/L   CO2 22 18 - 29 mmol/L   Calcium 9.4 8.7 - 10.2 mg/dL   Total Protein 6.8 6.0 - 8.5 g/dL    Albumin 4.2 3.5 - 5.5 g/dL   Globulin, Total 2.6 1.5 - 4.5 g/dL   Albumin/Globulin Ratio 1.6 1.1 - 2.5   Bilirubin Total 0.5 0.0 - 1.2 mg/dL   Alkaline Phosphatase 77 39 - 117 IU/L   AST 27 0 - 40 IU/L   ALT 31 0 - 44 IU/L  CBC with Differential/Platelet  Result Value Ref Range   WBC 11.1 (H) 3.4 - 10.8 x10E3/uL   RBC 5.03 4.14 - 5.80 x10E6/uL   Hemoglobin 15.6 12.6 - 17.7 g/dL   Hematocrit 44.5 37.5 - 51.0 %   MCV 89 79 - 97 fL   MCH 31.0 26.6 - 33.0 pg   MCHC 35.1 31.5 - 35.7 g/dL   RDW 13.9 12.3 - 15.4 %   Platelets 253 150 - 379 x10E3/uL   Neutrophils 58 %   Lymphs 32 %   Monocytes 7 %   Eos 3 %   Basos 0 %   Neutrophils Absolute 6.4 1.4 - 7.0 x10E3/uL   Lymphocytes Absolute 3.5 (H) 0.7 - 3.1 x10E3/uL   Monocytes Absolute 0.8 0.1 - 0.9 x10E3/uL   EOS (ABSOLUTE) 0.3 0.0 - 0.4 x10E3/uL  Basophils Absolute 0.0 0.0 - 0.2 x10E3/uL   Immature Granulocytes 0 %   Immature Grans (Abs) 0.0 0.0 - 0.1 x10E3/uL  Lipid Panel w/o Chol/HDL Ratio  Result Value Ref Range   Cholesterol, Total 218 (H) 100 - 199 mg/dL   Triglycerides 291 (H) 0 - 149 mg/dL   HDL 23 (L) >91 mg/dL   VLDL Cholesterol Cal Comment 5 - 40 mg/dL   LDL Calculated Comment 0 - 99 mg/dL  PSA, total and free  Result Value Ref Range   Prostate Specific Ag, Serum 0.4 0.0 - 4.0 ng/mL   PSA, Free 0.16 N/A ng/mL   PSA, Free Pct 40.0 %  Testosterone, free, total  Result Value Ref Range   Testosterone, Free 7.2 6.8 - 21.5 pg/mL  Urinalysis, Routine w reflex microscopic (not at [facility])  Result Value Ref Range   Specific Gravity, UA 1.025 1.005 - 1.030   pH, UA 6.0 5.0 - 7.5   Color, UA Yellow Yellow   Appearance Ur Clear Clear   Leukocytes, UA Negative Negative   Protein, UA Negative Negative/Trace   Glucose, UA Negative Negative   Ketones, UA Trace (A) Negative   RBC, UA Negative Negative   Bilirubin, UA Negative Negative   Urobilinogen, Ur 0.2 0.2 - 1.0 mg/dL   Nitrite, UA Negative Negative      Assessment &  Plan:   Problem List Items Addressed This Visit   None    Follow up plan: No follow-ups on file.

## 2021-11-11 ENCOUNTER — Encounter: Payer: Self-pay | Admitting: Nurse Practitioner

## 2021-11-11 ENCOUNTER — Telehealth: Payer: Self-pay

## 2021-11-11 ENCOUNTER — Ambulatory Visit (INDEPENDENT_AMBULATORY_CARE_PROVIDER_SITE_OTHER): Payer: 59 | Admitting: Nurse Practitioner

## 2021-11-11 VITALS — BP 137/93 | HR 85 | Temp 97.7°F | Ht 71.36 in | Wt 240.8 lb

## 2021-11-11 DIAGNOSIS — R030 Elevated blood-pressure reading, without diagnosis of hypertension: Secondary | ICD-10-CM

## 2021-11-11 DIAGNOSIS — N521 Erectile dysfunction due to diseases classified elsewhere: Secondary | ICD-10-CM

## 2021-11-11 DIAGNOSIS — J449 Chronic obstructive pulmonary disease, unspecified: Secondary | ICD-10-CM | POA: Diagnosis not present

## 2021-11-11 DIAGNOSIS — Z7689 Persons encountering health services in other specified circumstances: Secondary | ICD-10-CM

## 2021-11-11 DIAGNOSIS — E7800 Pure hypercholesterolemia, unspecified: Secondary | ICD-10-CM | POA: Diagnosis not present

## 2021-11-11 DIAGNOSIS — Z720 Tobacco use: Secondary | ICD-10-CM | POA: Diagnosis not present

## 2021-11-11 MED ORDER — ALBUTEROL SULFATE (2.5 MG/3ML) 0.083% IN NEBU
2.5000 mg | INHALATION_SOLUTION | Freq: Once | RESPIRATORY_TRACT | Status: AC
  Administered 2021-11-11: 2.5 mg via RESPIRATORY_TRACT

## 2021-11-11 MED ORDER — SILDENAFIL CITRATE 20 MG PO TABS: 40.0000 mg | ORAL_TABLET | Freq: Every day | ORAL | 2 refills | Status: DC | PRN

## 2021-11-11 MED ORDER — BREZTRI AEROSPHERE 160-9-4.8 MCG/ACT IN AERO: 2.0000 | INHALATION_SPRAY | Freq: Two times a day (BID) | RESPIRATORY_TRACT | 11 refills | Status: DC

## 2021-11-11 NOTE — Telephone Encounter (Signed)
PA has been denied for Sildenfail due to medication not being covered by plan. Please advise

## 2021-11-11 NOTE — Telephone Encounter (Signed)
PA has been initiated via covermymeds for Sildenafil. Awaiting determination     Key: [id number]

## 2021-11-11 NOTE — Assessment & Plan Note (Signed)
Chronic. Spirometry done at visit today. Showed Severe COPD. Will start Breztri BID. Discussed how to properly use medication.  Follow up in 1 month.  Call sooner if concerns arise.

## 2021-11-11 NOTE — Assessment & Plan Note (Signed)
Elevated at visit today.  Will follow up in 1 month and reevaluate.  Will discuss medication at that time if needed.

## 2021-11-11 NOTE — Progress Notes (Signed)
Results discussed with patient during visit.

## 2021-11-11 NOTE — Assessment & Plan Note (Signed)
Current everyday smoker. Smoking 1ppd.  Discussed cessation at visit today.  Patient plans to do it without medication.  Spirometry completed at visit today.

## 2021-11-11 NOTE — Assessment & Plan Note (Signed)
Labs ordered at visit today.  Will make recommendations based on lab results.   

## 2021-11-12 MED ORDER — TADALAFIL 10 MG PO TABS: 10.0000 mg | ORAL_TABLET | ORAL | 1 refills | Status: DC | PRN

## 2021-11-12 NOTE — Telephone Encounter (Signed)
Medication changed to cialis.

## 2021-12-22 NOTE — Progress Notes (Unsigned)
There were no vitals taken for this visit.   Subjective:    Patient ID: Luis Buck, male    DOB: 04/02/1970, 51 y.o.   MRN: [id number]  HPI: Luis Buck is a 51 y.o. male presenting on 12/23/2021 for comprehensive medical examination. Current medical complaints include:{Blank single:19197::"none","***"}  He currently lives with: Interim Problems from his last visit: {Blank single:19197::"yes","no"}  ELEVATED BLOOD PRESSURE Duration of elevated BP: {Blank single:19197::"chronic","unknown"} BP monitoring frequency: {Blank single:19197::"not checking","rarely","daily","weekly","monthly","a few times a day","a few times a week","a few times a month"} BP range:  Previous BP meds: {Blank single:19197::"yes","no"} Recent stressors: {Blank single:19197::"yes","no"} Family history of hypertension: {Blank single:19197::"yes","no"} Recurrent headaches: {Blank single:19197::"yes","no"} Visual changes: {Blank single:19197::"yes","no"} Palpitations: {Blank single:19197::"yes","no"}  Dyspnea: {Blank single:19197::"yes","no"} Chest pain: {Blank single:19197::"yes","no"} Lower extremity edema: {Blank single:19197::"yes","no"} Dizzy/lightheaded: {Blank single:19197::"yes","no"} Transient ischemic attacks: {Blank single:19197::"yes","no"}  HYPERLIPIDEMIA Hyperlipidemia status: {Blank single:19197::"excellent compliance","good compliance","fair compliance","poor compliance"} Satisfied with current treatment?  {Blank single:19197::"yes","no"} Side effects:  {Blank single:19197::"yes","no"} Medication compliance: {Blank single:19197::"excellent compliance","good compliance","fair compliance","poor compliance"} Past cholesterol meds: {Blank multiple:19196::"none","atorvastain (lipitor)","lovastatin (mevacor)","pravastatin (pravachol)","rosuvastatin (crestor)","simvastatin (zocor)","vytorin","fenofibrate (tricor)","gemfibrozil","ezetimide (zetia)","niaspan","lovaza"} Supplements: {Blank  multiple:19196::"none","fish oil","niacin","red yeast rice"} Aspirin:  {Blank single:19197::"yes","no"} The ASCVD Risk score (Arnett DK, et al., 2019) failed to calculate for the following reasons:   Cannot find a previous HDL lab   Cannot find a previous total cholesterol lab Chest pain:  {Blank single:19197::"yes","no"} Coronary artery disease:  {Blank single:19197::"yes","no"} Family history CAD:  {Blank single:19197::"yes","no"} Family history early CAD:  {Blank single:19197::"yes","no"}   Depression Screen done today and results listed below:      No data to display          The patient {has/does not [facility]:62376} a history of falls. I {did/did not:19850} complete a risk assessment for falls. A plan of care for falls {was/was not:19852} documented.   Past Medical History:  Past Medical History:  Diagnosis Date   Hypogonadism in male    Tobacco abuse     Surgical History:  Past Surgical History:  Procedure Laterality Date   BACK SURGERY     C6-7 fused   SHOULDER SURGERY     "cleaned out"    Medications:  Current Outpatient Medications on File Prior to Visit  Medication Sig   albuterol (PROVENTIL HFA;VENTOLIN HFA) 108 (90 BASE) MCG/ACT inhaler Inhale 2 puffs into the lungs every 4 (four) hours as needed for wheezing or shortness of breath. (Patient not taking: Reported on 11/11/2021)   Budeson-Glycopyrrol-Formoterol (BREZTRI AEROSPHERE) 160-9-4.8 MCG/ACT AERO Inhale 2 puffs into the lungs 2 (two) times daily.   fluticasone (FLONASE) 50 MCG/ACT nasal spray Place 2 sprays into both nostrils daily. (Patient not taking: Reported on 11/11/2021)   tadalafil (CIALIS) 10 MG tablet Take 1 tablet (10 mg total) by mouth as needed for erectile dysfunction.   No current facility-administered medications on file prior to visit.    Allergies:  No Known Allergies  Social History:  Social History   Socioeconomic History   Marital status: Married    Spouse name: Not on file    Number of children: Not on file   Years of education: Not on file   Highest education level: Not on file  Occupational History   Not on file  Tobacco Use   Smoking status: Every Day    Packs/day: 1.00    Types: Cigarettes   Smokeless tobacco: Former    Types: Chew    Quit date: 01/25/2004  Vaping Use   Vaping Use: Never used  Substance and Sexual Activity  Alcohol use: Yes    Comment: occasional   Drug use: No   Sexual activity: Yes  Other Topics Concern   Not on file  Social History Narrative   ** Merged History Encounter **       Social Determinants of Health   Financial Resource Strain: Not on file  Food Insecurity: Not on file  Transportation Needs: Not on file  Physical Activity: Not on file  Stress: Not on file  Social Connections: Not on file  Intimate Partner Violence: Not on file   Social History   Tobacco Use  Smoking Status Every Day   Packs/day: 1.00   Types: Cigarettes  Smokeless Tobacco Former   Types: Chew   Quit date: 01/25/2004   Social History   Substance and Sexual Activity  Alcohol Use Yes   Comment: occasional    Family History:  Family History  Problem Relation Age of Onset   Cancer Mother        lung, brain   Hypertension Father    Heart disease Maternal Grandfather     Past medical history, surgical history, medications, allergies, family history and social history reviewed with patient today and changes made to appropriate areas of the chart.   ROS All other ROS negative except what is listed above and in the HPI.      Objective:    There were no vitals taken for this visit.  Wt Readings from Last 3 Encounters:  11/11/21 240 lb 12.8 oz (109.2 kg)  07/17/14 237 lb 12.8 oz (107.9 kg)  07/11/14 237 lb (107.5 kg)    Physical Exam  Results for orders placed or performed in visit on 07/17/14  Comprehensive metabolic panel  Result Value Ref Range   Glucose 84 65 - 99 mg/dL   BUN 11 6 - 24 mg/dL   Creatinine, Ser 5.09  0.76 - 1.27 mg/dL   GFR calc non Af Amer 104 >59 mL/min/1.73   GFR calc Af Amer 120 >59 mL/min/1.73   BUN/Creatinine Ratio 12 9 - 20   Sodium 142 134 - 144 mmol/L   Potassium 4.5 3.5 - 5.2 mmol/L   Chloride 102 97 - 108 mmol/L   CO2 22 18 - 29 mmol/L   Calcium 9.4 8.7 - 10.2 mg/dL   Total Protein 6.8 6.0 - 8.5 g/dL   Albumin 4.2 3.5 - 5.5 g/dL   Globulin, Total 2.6 1.5 - 4.5 g/dL   Albumin/Globulin Ratio 1.6 1.1 - 2.5   Bilirubin Total 0.5 0.0 - 1.2 mg/dL   Alkaline Phosphatase 77 39 - 117 IU/L   AST 27 0 - 40 IU/L   ALT 31 0 - 44 IU/L  CBC with Differential/Platelet  Result Value Ref Range   WBC 11.1 (H) 3.4 - 10.8 x10E3/uL   RBC 5.03 4.14 - 5.80 x10E6/uL   Hemoglobin 15.6 12.6 - 17.7 g/dL   Hematocrit 32.6 71.2 - 51.0 %   MCV 89 79 - 97 fL   MCH 31.0 26.6 - 33.0 pg   MCHC 35.1 31.5 - 35.7 g/dL   RDW 45.8 09.9 - 83.3 %   Platelets 253 150 - 379 x10E3/uL   Neutrophils 58 %   Lymphs 32 %   Monocytes 7 %   Eos 3 %   Basos 0 %   Neutrophils Absolute 6.4 1.4 - 7.0 x10E3/uL   Lymphocytes Absolute 3.5 (H) 0.7 - 3.1 x10E3/uL   Monocytes Absolute 0.8 0.1 - 0.9 x10E3/uL   EOS (ABSOLUTE) 0.3 0.0 -  0.4 x10E3/uL   Basophils Absolute 0.0 0.0 - 0.2 x10E3/uL   Immature Granulocytes 0 %   Immature Grans (Abs) 0.0 0.0 - 0.1 x10E3/uL  Lipid Panel w/o Chol/HDL Ratio  Result Value Ref Range   Cholesterol, Total 218 (H) 100 - 199 mg/dL   Triglycerides 740 (H) 0 - 149 mg/dL   HDL 23 (L) >81 mg/dL   VLDL Cholesterol Cal Comment 5 - 40 mg/dL   LDL Calculated Comment 0 - 99 mg/dL  PSA, total and free  Result Value Ref Range   Prostate Specific Ag, Serum 0.4 0.0 - 4.0 ng/mL   PSA, Free 0.16 N/A ng/mL   PSA, Free Pct 40.0 %  Testosterone, free, total  Result Value Ref Range   Testosterone, Free 7.2 6.8 - 21.5 pg/mL  Urinalysis, Routine w reflex microscopic (not at [facility])  Result Value Ref Range   Specific Gravity, UA 1.025 1.005 - 1.030   pH, UA 6.0 5.0 - 7.5   Color, UA Yellow Yellow    Appearance Ur Clear Clear   Leukocytes, UA Negative Negative   Protein, UA Negative Negative/Trace   Glucose, UA Negative Negative   Ketones, UA Trace (A) Negative   RBC, UA Negative Negative   Bilirubin, UA Negative Negative   Urobilinogen, Ur 0.2 0.2 - 1.0 mg/dL   Nitrite, UA Negative Negative      Assessment & Plan:   Problem List Items Addressed This Visit       Respiratory   Chronic obstructive pulmonary disease (HCC) - Primary     Other   Elevated blood pressure reading   Hypercholesterolemia   Other Visit Diagnoses     Annual physical exam       Encounter for hepatitis C screening test for low risk patient       Screening for HIV (human immunodeficiency virus)            Discussed aspirin prophylaxis for myocardial infarction prevention and decision was {Blank single:19197::"it was not indicated","made to continue ASA","made to start ASA","made to stop ASA","that we recommended ASA, and patient refused"}  LABORATORY TESTING:  Health maintenance labs ordered today as discussed above.   The natural history of prostate cancer and ongoing controversy regarding screening and potential treatment outcomes of prostate cancer has been discussed with the patient. The meaning of a false positive PSA and a false negative PSA has been discussed. He indicates understanding of the limitations of this screening test and wishes *** to proceed with screening PSA testing.   IMMUNIZATIONS:   - Tdap: Tetanus vaccination status reviewed: {tetanus status:315746}. - Influenza: {Blank single:19197::"Up to date","Administered today","Postponed to flu season","Refused","Given elsewhere"} - Pneumovax: {Blank single:19197::"Up to date","Administered today","Not applicable","Refused","Given elsewhere"} - Prevnar: {Blank single:19197::"Up to date","Administered today","Not applicable","Refused","Given elsewhere"} - COVID: {Blank single:19197::"Up to date","Administered today","Not  applicable","Refused","Given elsewhere"} - HPV: {Blank single:19197::"Up to date","Administered today","Not applicable","Refused","Given elsewhere"} - Shingrix vaccine: {Blank single:19197::"Up to date","Administered today","Not applicable","Refused","Given elsewhere"}  SCREENING: - Colonoscopy: {Blank single:19197::"Up to date","Ordered today","Not applicable","Refused","Done elsewhere"}  Discussed with patient purpose of the colonoscopy is to detect colon cancer at curable precancerous or early stages   - AAA Screening: {Blank single:19197::"Up to date","Ordered today","Not applicable","Refused","Done elsewhere"}  -Hearing Test: {Blank single:19197::"Up to date","Ordered today","Not applicable","Refused","Done elsewhere"}  -Spirometry: {Blank single:19197::"Up to date","Ordered today","Not applicable","Refused","Done elsewhere"}   PATIENT COUNSELING:    Sexuality: Discussed sexually transmitted diseases, partner selection, use of condoms, avoidance of unintended pregnancy  and contraceptive alternatives.   Advised to avoid cigarette smoking.  I discussed with the  patient that most people either abstain from alcohol or drink within safe limits (<=14/week and <=4 drinks/occasion for males, <=7/weeks and <= 3 drinks/occasion for females) and that the risk for alcohol disorders and other health effects rises proportionally with the number of drinks per week and how often a drinker exceeds daily limits.  Discussed cessation/primary prevention of drug use and availability of treatment for abuse.   Diet: Encouraged to adjust caloric intake to maintain  or achieve ideal body weight, to reduce intake of dietary saturated fat and total fat, to limit sodium intake by avoiding high sodium foods and not adding table salt, and to maintain adequate dietary potassium and calcium preferably from fresh fruits, vegetables, and low-fat dairy products.    stressed the importance of regular exercise  Injury  prevention: Discussed safety belts, safety helmets, smoke detector, smoking near bedding or upholstery.   Dental health: Discussed importance of regular tooth brushing, flossing, and dental visits.   Follow up plan: NEXT PREVENTATIVE PHYSICAL DUE IN 1 YEAR. No follow-ups on file.

## 2021-12-23 ENCOUNTER — Telehealth: Payer: Self-pay

## 2021-12-23 ENCOUNTER — Encounter: Payer: Self-pay | Admitting: Nurse Practitioner

## 2021-12-23 ENCOUNTER — Ambulatory Visit (INDEPENDENT_AMBULATORY_CARE_PROVIDER_SITE_OTHER): Payer: 59 | Admitting: Nurse Practitioner

## 2021-12-23 ENCOUNTER — Other Ambulatory Visit: Payer: Self-pay

## 2021-12-23 VITALS — BP 116/71 | HR 79 | Temp 98.5°F | Ht 71.6 in | Wt 239.8 lb

## 2021-12-23 DIAGNOSIS — J449 Chronic obstructive pulmonary disease, unspecified: Secondary | ICD-10-CM

## 2021-12-23 DIAGNOSIS — Z0000 Encounter for general adult medical examination without abnormal findings: Secondary | ICD-10-CM

## 2021-12-23 DIAGNOSIS — R030 Elevated blood-pressure reading, without diagnosis of hypertension: Secondary | ICD-10-CM

## 2021-12-23 DIAGNOSIS — Z6832 Body mass index (BMI) 32.0-32.9, adult: Secondary | ICD-10-CM

## 2021-12-23 DIAGNOSIS — E7800 Pure hypercholesterolemia, unspecified: Secondary | ICD-10-CM

## 2021-12-23 DIAGNOSIS — Z1159 Encounter for screening for other viral diseases: Secondary | ICD-10-CM

## 2021-12-23 DIAGNOSIS — Z1211 Encounter for screening for malignant neoplasm of colon: Secondary | ICD-10-CM

## 2021-12-23 DIAGNOSIS — Z23 Encounter for immunization: Secondary | ICD-10-CM

## 2021-12-23 DIAGNOSIS — Z114 Encounter for screening for human immunodeficiency virus [HIV]: Secondary | ICD-10-CM

## 2021-12-23 LAB — URINALYSIS, ROUTINE W REFLEX MICROSCOPIC
Bilirubin, UA: NEGATIVE
Glucose, UA: NEGATIVE
Ketones, UA: NEGATIVE
Leukocytes,UA: NEGATIVE
Nitrite, UA: NEGATIVE
Protein,UA: NEGATIVE
RBC, UA: NEGATIVE
Specific Gravity, UA: <1.005 — ABNORMAL LOW (ref 1.005–1.030)
Urobilinogen, Ur: 0.2 mg/dL (ref 0.2–1.0)
pH, UA: 6 (ref 5.0–7.5)

## 2021-12-23 MED ORDER — NA SULFATE-K SULFATE-MG SULF 17.5-3.13-1.6 GM/177ML PO SOLN: 1.0000 | Freq: Once | ORAL | 0 refills | Status: AC

## 2021-12-23 NOTE — Assessment & Plan Note (Signed)
Chronic. Improved with Breztri.  Will continue with current medication regimen.  Follow up in 6 months.  Call sooner if concerns arise.

## 2021-12-23 NOTE — Telephone Encounter (Signed)
Gastroenterology Pre-Procedure Review  Request Date: 02/03/22 Requesting Physician: Dr. Servando Snare  PATIENT REVIEW QUESTIONS: The patient responded to the following health history questions as indicated:    1. Are you having any GI issues? no 2. Do you have a personal history of Polyps? no 3. Do you have a family history of Colon Cancer or Polyps? no 4. Diabetes Mellitus? no 5. Joint replacements in the past 12 months?no 6. Major health problems in the past 3 months?no 7. Any artificial heart valves, MVP, or defibrillator?no    MEDICATIONS & ALLERGIES:    Patient reports the following regarding taking any anticoagulation/antiplatelet therapy:   Plavix, Coumadin, Eliquis, Xarelto, Lovenox, Pradaxa, Brilinta, or Effient? no Aspirin? no  Patient confirms/reports the following medications:  Current Outpatient Medications  Medication Sig Dispense Refill   Budeson-Glycopyrrol-Formoterol (BREZTRI AEROSPHERE) 160-9-4.8 MCG/ACT AERO Inhale 2 puffs into the lungs 2 (two) times daily. 10.7 g 11   tadalafil (CIALIS) 10 MG tablet Take 1 tablet (10 mg total) by mouth as needed for erectile dysfunction. 10 tablet 1   No current facility-administered medications for this visit.    Patient confirms/reports the following allergies:  No Known Allergies  No orders of the defined types were placed in this encounter.   AUTHORIZATION INFORMATION Primary Insurance: 1D#: Group #:  Secondary Insurance: 1D#: Group #:  SCHEDULE INFORMATION: Date: 02/03/22 Time: Location: [facility]

## 2021-12-23 NOTE — Assessment & Plan Note (Signed)
Well controlled at visit today.  Will continue to check at future visits.

## 2021-12-23 NOTE — Assessment & Plan Note (Signed)
Labs ordered at visit today.  Will make recommendations based on lab results.   

## 2021-12-23 NOTE — Assessment & Plan Note (Signed)
Recommended eating smaller high protein, low fat meals more frequently and exercising 30 mins a day 5 times a week with a goal of 10-15lb weight loss in the next 3 months. Patient voiced their understanding and motivation to adhere to these recommendations.  

## 2021-12-24 LAB — LIPID PANEL
Chol/HDL Ratio: 7.3 ratio — ABNORMAL HIGH (ref 0.0–5.0)
Cholesterol, Total: 175 mg/dL (ref 100–199)
HDL: 24 mg/dL — ABNORMAL LOW (ref >39)
LDL Chol Calc (NIH): 110 mg/dL — ABNORMAL HIGH (ref 0–99)
Triglycerides: 236 mg/dL — ABNORMAL HIGH (ref 0–149)
VLDL Cholesterol Cal: 41 mg/dL — ABNORMAL HIGH (ref 5–40)

## 2021-12-24 LAB — CBC WITH DIFFERENTIAL/PLATELET
Basophils Absolute: 0.1 10*3/uL (ref 0.0–0.2)
Basos: 1 %
EOS (ABSOLUTE): 0.2 10*3/uL (ref 0.0–0.4)
Eos: 2 %
Hematocrit: 45 % (ref 37.5–51.0)
Hemoglobin: 15.7 g/dL (ref 13.0–17.7)
Immature Grans (Abs): 0 10*3/uL (ref 0.0–0.1)
Immature Granulocytes: 0 %
Lymphocytes Absolute: 3 10*3/uL (ref 0.7–3.1)
Lymphs: 28 %
MCH: 30.9 pg (ref 26.6–33.0)
MCHC: 34.9 g/dL (ref 31.5–35.7)
MCV: 89 fL (ref 79–97)
Monocytes Absolute: 0.6 10*3/uL (ref 0.1–0.9)
Monocytes: 5 %
Neutrophils Absolute: 6.9 10*3/uL (ref 1.4–7.0)
Neutrophils: 64 %
Platelets: 231 10*3/uL (ref 150–450)
RBC: 5.08 x10E6/uL (ref 4.14–5.80)
RDW: 12.7 % (ref 11.6–15.4)
WBC: 10.8 10*3/uL (ref 3.4–10.8)

## 2021-12-24 LAB — COMPREHENSIVE METABOLIC PANEL
ALT: 27 IU/L (ref 0–44)
AST: 22 IU/L (ref 0–40)
Albumin/Globulin Ratio: 1.5 (ref 1.2–2.2)
Albumin: 4.1 g/dL (ref 3.8–4.9)
Alkaline Phosphatase: 84 IU/L (ref 44–121)
BUN/Creatinine Ratio: 10 (ref 9–20)
BUN: 10 mg/dL (ref 6–24)
Bilirubin Total: 0.6 mg/dL (ref 0.0–1.2)
CO2: 21 mmol/L (ref 20–29)
Calcium: 9.2 mg/dL (ref 8.7–10.2)
Chloride: 100 mmol/L (ref 96–106)
Creatinine, Ser: 1.02 mg/dL (ref 0.76–1.27)
Globulin, Total: 2.8 g/dL (ref 1.5–4.5)
Glucose: 96 mg/dL (ref 70–99)
Potassium: 4.3 mmol/L (ref 3.5–5.2)
Sodium: 136 mmol/L (ref 134–144)
Total Protein: 6.9 g/dL (ref 6.0–8.5)
eGFR: 89 mL/min/{1.73_m2} (ref >59)

## 2021-12-24 LAB — HEPATITIS C ANTIBODY: Hep C Virus Ab: NONREACTIVE

## 2021-12-24 LAB — TSH: TSH: 1.16 u[IU]/mL (ref 0.450–4.500)

## 2021-12-24 LAB — HIV ANTIBODY (ROUTINE TESTING W REFLEX): HIV Screen 4th Generation wRfx: NONREACTIVE

## 2021-12-24 LAB — PSA: Prostate Specific Ag, Serum: 0.5 ng/mL (ref 0.0–4.0)

## 2021-12-24 NOTE — Progress Notes (Signed)
Please let patient know that his cholesterol is elevated.  His cardiac risk score puts him at high risk of having a stroke or heart attack over the next 10 years.  I recommend that he start a statin called crestor 5mg  daily.  The goal will be to increase this to 20mg  daily if patient tolerates it well.  If he agrees to the medication I can send it to the pharmacy.    No other concerns at this time.  The rest of his blood work looks great.    The 10-year ASCVD risk score (Arnett DK, et al., 2019) is: 11.9%   Values used to calculate the score:     Age: 51 years     Sex: Male     Is Non-Hispanic African American: No     Diabetic: No     Tobacco smoker: Yes     Systolic Blood Pressure: 116 mmHg     Is BP treated: No     HDL Cholesterol: 24 mg/dL     Total Cholesterol: 175 mg/dL

## 2022-01-26 ENCOUNTER — Encounter: Payer: Self-pay | Admitting: Gastroenterology

## 2022-02-03 ENCOUNTER — Ambulatory Visit: Payer: 59 | Admitting: General Practice

## 2022-02-03 ENCOUNTER — Encounter: Payer: Self-pay | Admitting: Gastroenterology

## 2022-02-03 ENCOUNTER — Other Ambulatory Visit: Payer: Self-pay

## 2022-02-03 ENCOUNTER — Ambulatory Visit
Admission: RE | Admit: 2022-02-03 | Discharge: 2022-02-03 | Disposition: A | Discharge status: Home / Self Care | Payer: 59 | Source: Ambulatory Visit | Attending: Gastroenterology | Admitting: Gastroenterology

## 2022-02-03 ENCOUNTER — Encounter
Admission: RE | Disposition: A | Discharge status: Home / Self Care | Payer: Self-pay | Source: Ambulatory Visit | Attending: Gastroenterology

## 2022-02-03 DIAGNOSIS — Z1211 Encounter for screening for malignant neoplasm of colon: Secondary | ICD-10-CM | POA: Diagnosis not present

## 2022-02-03 DIAGNOSIS — J449 Chronic obstructive pulmonary disease, unspecified: Secondary | ICD-10-CM | POA: Diagnosis not present

## 2022-02-03 DIAGNOSIS — F17210 Nicotine dependence, cigarettes, uncomplicated: Secondary | ICD-10-CM | POA: Insufficient documentation

## 2022-02-03 DIAGNOSIS — K5730 Diverticulosis of large intestine without perforation or abscess without bleeding: Secondary | ICD-10-CM | POA: Diagnosis not present

## 2022-02-03 DIAGNOSIS — K640 First degree hemorrhoids: Secondary | ICD-10-CM | POA: Insufficient documentation

## 2022-02-03 HISTORY — PX: COLONOSCOPY WITH PROPOFOL: SHX5780

## 2022-02-03 HISTORY — DX: Anesthesia of skin: R20.0

## 2022-02-03 HISTORY — DX: Unspecified osteoarthritis, unspecified site: M19.90

## 2022-02-03 HISTORY — DX: Chronic obstructive pulmonary disease, unspecified: J44.9

## 2022-02-03 HISTORY — DX: Torticollis: M43.6

## 2022-02-03 HISTORY — DX: Presence of external hearing-aid: Z97.4

## 2022-02-03 SURGERY — COLONOSCOPY WITH PROPOFOL
Anesthesia: General | Site: Rectum

## 2022-02-03 MED ORDER — SODIUM CHLORIDE 0.9 % IV SOLN: INTRAVENOUS | Status: DC

## 2022-02-03 MED ORDER — PROPOFOL 500 MG/50ML IV EMUL
INTRAVENOUS | Status: DC | PRN
  Administered 2022-02-03: 140 ug/kg/min via INTRAVENOUS

## 2022-02-03 MED ORDER — LIDOCAINE HCL (CARDIAC) PF 100 MG/5ML IV SOSY
PREFILLED_SYRINGE | INTRAVENOUS | Status: DC | PRN
  Administered 2022-02-03: 30 mg via INTRAVENOUS

## 2022-02-03 MED ORDER — STERILE WATER FOR IRRIGATION IR SOLN
Status: DC | PRN
  Administered 2022-02-03: 1

## 2022-02-03 MED ORDER — LACTATED RINGERS IV SOLN: INTRAVENOUS | Status: DC

## 2022-02-03 MED ORDER — PROPOFOL 10 MG/ML IV BOLUS
INTRAVENOUS | Status: DC | PRN
  Administered 2022-02-03: 90 mg via INTRAVENOUS

## 2022-02-03 SURGICAL SUPPLY — 21 items

## 2022-02-03 NOTE — Anesthesia Postprocedure Evaluation (Signed)
Anesthesia Post Note  Patient: Luis Buck  Procedure(s) Performed: COLONOSCOPY WITH PROPOFOL (Rectum)  Patient location during evaluation: PACU Anesthesia Type: General Level of consciousness: awake and alert Pain management: pain level controlled Vital Signs Assessment: post-procedure vital signs reviewed and stable Respiratory status: spontaneous breathing, nonlabored ventilation, respiratory function stable and patient connected to nasal cannula oxygen Cardiovascular status: blood pressure returned to baseline and stable Postop Assessment: no apparent nausea or vomiting Anesthetic complications: no  No notable events documented.   Last Vitals:  Vitals:   02/03/22 0809 02/03/22 0819  BP: 119/78   Resp: 18 18  Temp: 36.6 C (!) 36.4 C  SpO2: 95% 98%    Last Pain:  Vitals:   02/03/22 0819  TempSrc:   PainSc: 0-No pain                 Ilene Qua

## 2022-02-03 NOTE — Anesthesia Preprocedure Evaluation (Signed)
Anesthesia Evaluation  Patient identified by MRN, date of birth, ID band Patient awake    Reviewed: Allergy & Precautions, NPO status , Patient's Chart, lab work & pertinent test results  History of Anesthesia Complications Negative for: history of anesthetic complications  Airway Mallampati: II  TM Distance: >3 FB Neck ROM: full    Dental  (+) Poor Dentition   Pulmonary COPD, Current Smoker   Pulmonary exam normal        Cardiovascular negative cardio ROS Normal cardiovascular exam     Neuro/Psych negative neurological ROS  negative psych ROS   GI/Hepatic negative GI ROS, Neg liver ROS,,,  Endo/Other  negative endocrine ROS    Renal/GU negative Renal ROS  negative genitourinary   Musculoskeletal   Abdominal   Peds  Hematology negative hematology ROS (+)   Anesthesia Other Findings Past Medical History: No date: Arthritis No date: COPD (chronic obstructive pulmonary disease) (HCC) No date: Finger numbness     Comment:  right hand.  S/P pinched nerve damage No date: Hypogonadism in male No date: Neck stiffness     Comment:  limited movement due to plate in neck No date: Tobacco abuse No date: Wears hearing aid in both ears  Past Surgical History: 2000: BACK SURGERY     Comment:  C6-7 fused.  [facility] [place].  [place] No date: SHOULDER SURGERY     Comment:  "cleaned out"  BMI    Body Mass Index: 32.18 kg/m      Reproductive/Obstetrics negative OB ROS                             Anesthesia Physical Anesthesia Plan  ASA: 2  Anesthesia Plan: General   Post-op Pain Management: Minimal or no pain anticipated   Induction: Intravenous  PONV Risk Score and Plan: Propofol infusion and TIVA  Airway Management Planned: Natural Airway and Nasal Cannula  Additional Equipment:   Intra-op Plan:   Post-operative Plan:   Informed Consent: I have reviewed the patients  History and Physical, chart, labs and discussed the procedure including the risks, benefits and alternatives for the proposed anesthesia with the patient or authorized representative who has indicated his/her understanding and acceptance.     Dental Advisory Given  Plan Discussed with: Anesthesiologist, CRNA and Surgeon  Anesthesia Plan Comments: (Patient consented for risks of anesthesia including but not limited to:  - adverse reactions to medications - risk of airway placement if required - damage to eyes, teeth, lips or other oral mucosa - nerve damage due to positioning  - sore throat or hoarseness - Damage to heart, brain, nerves, lungs, other parts of body or loss of life  Patient voiced understanding.)       Anesthesia Quick Evaluation

## 2022-02-03 NOTE — H&P (Signed)
Luis Lame, MD [facility] [address], [place], [place] [address] Phone: [phone] Fax : [phone]  Primary Care Physician:  Jon Billings, NP Primary Gastroenterologist:  Dr. Allen Norris  Pre-Procedure History & Physical: HPI:  [name] Luis Buck is a 52 y.o. male is here for a screening colonoscopy.   Past Medical History:  Diagnosis Date   Arthritis    COPD (chronic obstructive pulmonary disease) (HCC)    Finger numbness    right hand.  S/P pinched nerve damage   Hypogonadism in male    Neck stiffness    limited movement due to plate in neck   Tobacco abuse    Wears hearing aid in both ears     Past Surgical History:  Procedure Laterality Date   BACK SURGERY  2000   C6-7 fused.  [facility] [place].  [place]   SHOULDER SURGERY     "cleaned out"    Prior to Admission medications   Medication Sig Start Date End Date Taking? Authorizing Provider  Budeson-Glycopyrrol-Formoterol (BREZTRI AEROSPHERE) 160-9-4.8 MCG/ACT AERO Inhale 2 puffs into the lungs 2 (two) times daily. 11/11/21  Yes Jon Billings, NP  Multiple Vitamin (MULTIVITAMIN) tablet Take 1 tablet by mouth daily.   Yes [provider]  tadalafil (CIALIS) 10 MG tablet Take 1 tablet (10 mg total) by mouth as needed for erectile dysfunction. 11/12/21  Yes Jon Billings, NP    Allergies as of 12/23/2021   (No Known Allergies)    Family History  Problem Relation Age of Onset   Cancer Mother        lung, brain   Hypertension Father    Heart disease Maternal Grandfather     Social History   Socioeconomic History   Marital status: Married    Spouse name: Not on file   Number of children: Not on file   Years of education: Not on file   Highest education level: Not on file  Occupational History   Not on file  Tobacco Use   Smoking status: Every Day    Packs/day: 1.00    Types: Cigarettes   Smokeless tobacco: Former    Types: Chew    Quit date: 01/25/2004   Tobacco comments:     01/26/22 - currently down to 4-5 cigs/day.  Vaping Use   Vaping Use: Never used  Substance and Sexual Activity   Alcohol use: Yes    Comment: occasional   Drug use: No   Sexual activity: Yes  Other Topics Concern   Not on file  Social History Narrative   ** Merged History Encounter **       Social Determinants of Health   Financial Resource Strain: Not on file  Food Insecurity: Not on file  Transportation Needs: Not on file  Physical Activity: Not on file  Stress: Not on file  Social Connections: Not on file  Intimate Partner Violence: Not on file    Review of Systems: See HPI, otherwise negative ROS  Physical Exam: BP 114/88   Temp 98 F (36.7 C) (Tympanic)   Ht 5' 11.61" (1.819 m)   Wt 106.5 kg   SpO2 95%   BMI 32.18 kg/m  General:   Alert,  pleasant and cooperative in NAD Head:  Normocephalic and atraumatic. Neck:  Supple; no masses or thyromegaly. Lungs:  Clear throughout to auscultation.    Heart:  Regular rate and rhythm. Abdomen:  Soft, nontender and nondistended. Normal bowel sounds, without guarding, and without rebound.  Neurologic:  Alert and  oriented x4;  grossly normal neurologically.  Impression/Plan: MEKEL HAVERSTOCK is now here to undergo a screening colonoscopy.  Risks, benefits, and alternatives regarding colonoscopy have been reviewed with the patient.  Questions have been answered.  All parties agreeable.

## 2022-02-03 NOTE — Transfer of Care (Signed)
Immediate Anesthesia Transfer of Care Note  Patient: Luis Buck  Procedure(s) Performed: COLONOSCOPY WITH PROPOFOL (Rectum)  Patient Location: PACU  Anesthesia Type:General  Level of Consciousness: awake and alert   Airway & Oxygen Therapy: Patient Spontanous Breathing  Post-op Assessment: Report given to RN  Post vital signs: Reviewed and stable  Last Vitals:  Vitals Value Taken Time  BP 119/78 02/03/22 0809  Temp 36.6 C 02/03/22 0809  Pulse 77 02-03-22 0809  Resp 18 02/03/22 0809  SpO2 95 % 02/03/22 0809    Last Pain:  Vitals:   02/03/22 0809  TempSrc:   PainSc: 0-No pain         Complications: No notable events documented.

## 2022-02-03 NOTE — Op Note (Signed)
[facility] Gastroenterology Patient Name: Luis Buck Procedure Date: 02/03/2022 7:29 AM MRN: [id number] Account #: [id number] Date of Birth: 12/16/1970 Admit Type: Outpatient Age: 52 Room: [facility] OR ROOM 01 Gender: Male Note Status: Finalized Instrument Name: [id number] Procedure:             Colonoscopy Indications:           Screening for colorectal malignant neoplasm Providers:             [name] MD, MD Referring MD:          Jon Billings (Referring MD) Medicines:             Propofol per Anesthesia Complications:         No immediate complications. Procedure:             Pre-Anesthesia Assessment:                        - Prior to the procedure, a History and Physical was                         performed, and patient medications and allergies were                         reviewed. The patient's tolerance of previous                         anesthesia was also reviewed. The risks and benefits                         of the procedure and the sedation options and risks                         were discussed with the patient. All questions were                         answered, and informed consent was obtained. Prior                         Anticoagulants: The patient has taken no anticoagulant                         or antiplatelet agents. ASA Grade Assessment: II - A                         patient with mild systemic disease. After reviewing                         the risks and benefits, the patient was deemed in                         satisfactory condition to undergo the procedure.                        After obtaining informed consent, the colonoscope was                         passed under direct vision. Throughout the procedure,  the patient's blood pressure, pulse, and oxygen                         saturations were monitored continuously. The                         Colonoscope was introduced through the anus and                          advanced to the the cecum, identified by appendiceal                         orifice and ileocecal valve. The colonoscopy was                         performed without difficulty. The patient tolerated                         the procedure well. The quality of the bowel                         preparation was excellent. Findings:      The perianal and digital rectal examinations were normal.      Non-bleeding internal hemorrhoids were found during retroflexion. The       hemorrhoids were Grade I (internal hemorrhoids that do not prolapse).      Multiple small-mouthed diverticula were found in the sigmoid colon. Impression:            - Non-bleeding internal hemorrhoids.                        - Diverticulosis in the sigmoid colon.                        - No specimens collected. Recommendation:        - Discharge patient to home.                        - Resume previous diet.                        - Continue present medications.                        - Repeat colonoscopy in 10 years for screening                         purposes. Procedure Code(s):     --- Professional ---                        [phone], Colonoscopy, flexible; diagnostic, including                         collection of specimen(s) by brushing or washing, when                         performed (separate procedure) Diagnosis Code(s):     --- Professional ---  Z12.11, Encounter for screening for malignant neoplasm                         of colon CPT copyright 2022 American Medical Association. All rights reserved. The codes documented in this report are preliminary and upon coder review may  be revised to meet current compliance requirements. [name] MD, MD 02/03/2022 8:09:07 AM This report has been signed electronically. Number of Addenda: 0 Note Initiated On: 02/03/2022 7:29 AM Scope Withdrawal Time: 0 hours 9 minutes 49 seconds  Total Procedure Duration: 0 hours 11  minutes 58 seconds  Estimated Blood Loss:  Estimated blood loss: none.      [facility]

## 2022-02-04 ENCOUNTER — Telehealth: Payer: Self-pay | Admitting: Nurse Practitioner

## 2022-02-04 NOTE — Telephone Encounter (Signed)
Pt is requesting an alternative generic version for Budeson-Glycopyrrol-Formoterol (BREZTRI AEROSPHERE) 160-9-4.8 MCG/ACT AERO his insurance no longer covers this.   He needs 90 day supply sent via [facility] [facility].

## 2022-02-07 MED ORDER — FLUTICASONE FUROATE-VILANTEROL 200-25 MCG/ACT IN AEPB: 1.0000 | INHALATION_SPRAY | Freq: Every day | RESPIRATORY_TRACT | 1 refills | Status: DC

## 2022-02-07 NOTE — Telephone Encounter (Signed)
Tried calling patient, no answer and VM full. Will try to call again.   OK for [facility] to speak with patient and advise him of Karen's message and new inhaler sent in for him.

## 2022-02-07 NOTE — Telephone Encounter (Signed)
Breo sent to the pharmacy for patient.

## 2022-02-07 NOTE — Telephone Encounter (Signed)
The patient would like to speak further with a member of clinical staff regarding potential alternatives for the medication   The patient is interested in being prescribed, (per the information provided by their insurance)   Flutic/Salme Aer 250/50   Symbacort   BREO ELLIPTA  Please contact further when possible

## 2022-02-07 NOTE — Telephone Encounter (Signed)
There is not a generic option for this medication.  Does patient know that his insurance will cover?

## 2022-02-07 NOTE — Telephone Encounter (Signed)
Alternatives listed below.

## 2022-02-08 NOTE — Telephone Encounter (Signed)
Called and notified patient of medication change.  

## 2022-06-01 ENCOUNTER — Other Ambulatory Visit: Payer: Self-pay | Admitting: Nurse Practitioner

## 2022-06-01 MED ORDER — TADALAFIL 10 MG PO TABS: 10.0000 mg | ORAL_TABLET | ORAL | 0 refills | Status: DC | PRN

## 2022-06-01 NOTE — Telephone Encounter (Signed)
Medication Refill - Medication: sildenafil (REVATIO) 20 MG tablet   Has the patient contacted their pharmacy? Yes   Preferred Pharmacy (with phone number or street name):   [facility] [address]), [place] - [address]  [address]) [place] [address]  Phone: [phone] Fax: [phone]  Hours: Not open 24 hours     Has the patient been seen for an appointment in the last year OR does the patient have an upcoming appointment? Yes.    Agent: Please be advised that RX refills may take up to 3 business days. We ask that you follow-up with your pharmacy.

## 2022-06-01 NOTE — Telephone Encounter (Signed)
Requested Prescriptions  Pending Prescriptions Disp Refills   tadalafil (CIALIS) 10 MG tablet 10 tablet 0    Sig: Take 1 tablet (10 mg total) by mouth as needed for erectile dysfunction.     Urology: Erectile Dysfunction Agents Passed - 06/01/2022  1:23 PM      Passed - AST in normal range and within 360 days    AST  Date Value Ref Range Status  12/23/2021 22 0 - 40 IU/L Final         Passed - ALT in normal range and within 360 days    ALT  Date Value Ref Range Status  12/23/2021 27 0 - 44 IU/L Final         Passed - Last BP in normal range    BP Readings from Last 1 Encounters:  02/03/22 119/78         Passed - Valid encounter within last 12 months    Recent Outpatient Visits           5 months ago Annual physical exam   [place] [facility] Larae Grooms, NP   6 months ago Chronic obstructive pulmonary disease, unspecified COPD type [facility])   [place] [facility] Larae Grooms, NP   7 years ago Preventative health care   [place] [facility] Lada, Janit Bern, MD   7 years ago Bronchitis   [place] [facility] Lada, Janit Bern, MD       Future Appointments             In 3 weeks Larae Grooms, NP [facility], [facility]

## 2022-06-02 ENCOUNTER — Other Ambulatory Visit: Payer: Self-pay | Admitting: Nurse Practitioner

## 2022-06-02 NOTE — Telephone Encounter (Signed)
Unable to refill per protocol, Rx expired. Discontinued 11/12/21, dose change.  Requested Prescriptions  Pending Prescriptions Disp Refills   sildenafil (REVATIO) 20 MG tablet [Pharmacy Med Name: Sildenafil Citrate 20 MG Oral Tablet] 20 tablet 0    Sig: TAKE 2 TABLETS BY MOUTH ONCE DAILY AS NEEDED PRIOR TO INTIMACY     Urology: Erectile Dysfunction Agents Passed - 06/02/2022  3:07 PM      Passed - AST in normal range and within 360 days    AST  Date Value Ref Range Status  12/23/2021 22 0 - 40 IU/L Final         Passed - ALT in normal range and within 360 days    ALT  Date Value Ref Range Status  12/23/2021 27 0 - 44 IU/L Final         Passed - Last BP in normal range    BP Readings from Last 1 Encounters:  02/03/22 119/78         Passed - Valid encounter within last 12 months    Recent Outpatient Visits           5 months ago Annual physical exam   [place] [facility] Larae Grooms, NP   6 months ago Chronic obstructive pulmonary disease, unspecified COPD type [facility])   [place] [facility]ae Grooms, NP   7 years ago Preventative health care   [place] [facility] Lada, Janit Bern, MD   7 years ago Bronchitis   [place] [facility] Lada, Janit Bern, MD       Future Appointments             In 3 weeks Larae Grooms, NP [facility], [facility]

## 2022-06-14 ENCOUNTER — Encounter: Payer: Self-pay | Admitting: Nurse Practitioner

## 2022-06-14 ENCOUNTER — Ambulatory Visit (INDEPENDENT_AMBULATORY_CARE_PROVIDER_SITE_OTHER): Payer: 59 | Admitting: Nurse Practitioner

## 2022-06-14 VITALS — BP 116/77 | HR 93 | Temp 98.1°F | Ht 71.61 in | Wt 243.6 lb

## 2022-06-14 DIAGNOSIS — J449 Chronic obstructive pulmonary disease, unspecified: Secondary | ICD-10-CM | POA: Diagnosis not present

## 2022-06-14 DIAGNOSIS — E7800 Pure hypercholesterolemia, unspecified: Secondary | ICD-10-CM | POA: Diagnosis not present

## 2022-06-14 DIAGNOSIS — R030 Elevated blood-pressure reading, without diagnosis of hypertension: Secondary | ICD-10-CM

## 2022-06-14 MED ORDER — TADALAFIL 20 MG PO TABS: 20.0000 mg | ORAL_TABLET | ORAL | 3 refills | Status: DC | PRN

## 2022-06-14 MED ORDER — ALBUTEROL SULFATE HFA 108 (90 BASE) MCG/ACT IN AERS: 2.0000 | INHALATION_SPRAY | Freq: Four times a day (QID) | RESPIRATORY_TRACT | 1 refills | Status: DC | PRN

## 2022-06-14 NOTE — Assessment & Plan Note (Signed)
Chronic.  Controlled.  Continue with current medication regimen of Breo and Albuterol PRN.  Refills sent today.  Labs ordered today.  Return to clinic in 6 months for reevaluation.  Call sooner if concerns arise.

## 2022-06-14 NOTE — Assessment & Plan Note (Signed)
Labs ordered at visit today.  Will make recommendations based on lab results.   

## 2022-06-14 NOTE — Progress Notes (Signed)
BP 116/77   Pulse 93   Temp 98.1 F (36.7 C) (Oral)   Ht 5' 11.61" (1.819 m)   Wt 243 lb 9.6 oz (110.5 kg)   SpO2 97%   BMI 33.40 kg/m    Subjective:    Patient ID: Luis Buck, male    DOB: 02/27/1970, 52 y.o.   MRN: [id number]  HPI: DRISTON ADERHOLT is a 52 y.o. male  Chief Complaint  Patient presents with   Medication Refill   ELEVATED BLOOD PRESSURE / HYPERLIPIDEMIA Satisfied with current treatment? yes Duration of hypertension: years BP monitoring frequency: not checking BP range:  BP medication side effects: no Past BP meds: none Duration of hyperlipidemia: years Cholesterol medication side effects: no Cholesterol supplements: none Past cholesterol medications: none Medication compliance: excellent compliance Aspirin: no Recent stressors: no Recurrent headaches: no Visual changes: no Palpitations: no Dyspnea: no Chest pain: no Lower extremity edema: no Dizzy/lightheaded: no   COPD Patient is doing well with Breo.  He does feel like it is helping him with his breathing.  If he forgets to take it he feels a difference throughout the day.   COPD status: stable Satisfied with current treatment?: yes Oxygen use: no Dyspnea frequency: some Cough frequency: no Rescue inhaler frequency:  only when he is chasing grandkids Limitation of activity: yes Productive cough:  Last Spirometry:  Pneumovax: Up to Date Influenza: Up to Date  Relevant past medical, surgical, family and social history reviewed and updated as indicated. Interim medical history since our last visit reviewed. Allergies and medications reviewed and updated.  Review of Systems  Eyes:  Negative for visual disturbance.  Respiratory:  Negative for shortness of breath.   Cardiovascular:  Negative for chest pain and leg swelling.  Neurological:  Negative for light-headedness and headaches.    Per HPI unless specifically indicated above     Objective:    BP 116/77   Pulse 93    Temp 98.1 F (36.7 C) (Oral)   Ht 5' 11.61" (1.819 m)   Wt 243 lb 9.6 oz (110.5 kg)   SpO2 97%   BMI 33.40 kg/m   Wt Readings from Last 3 Encounters:  06/14/22 243 lb 9.6 oz (110.5 kg)  02/03/22 234 lb 11.2 oz (106.5 kg)  12/23/21 239 lb 12.8 oz (108.8 kg)    Physical Exam Vitals and nursing note reviewed.  Constitutional:      General: He is not in acute distress.    Appearance: Normal appearance. He is not ill-appearing, toxic-appearing or diaphoretic.  HENT:     Head: Normocephalic.     Right Ear: External ear normal.     Left Ear: External ear normal.     Nose: Nose normal. No congestion or rhinorrhea.     Mouth/Throat:     Mouth: Mucous membranes are moist.  Eyes:     General:        Right eye: No discharge.        Left eye: No discharge.     Extraocular Movements: Extraocular movements intact.     Conjunctiva/sclera: Conjunctivae normal.     Pupils: Pupils are equal, round, and reactive to light.  Cardiovascular:     Rate and Rhythm: Normal rate and regular rhythm.     Heart sounds: No murmur heard. Pulmonary:     Effort: Pulmonary effort is normal. No respiratory distress.     Breath sounds: Normal breath sounds. No wheezing, rhonchi or rales.  Abdominal:  General: Abdomen is flat. Bowel sounds are normal.  Musculoskeletal:     Cervical back: Normal range of motion and neck supple.  Skin:    General: Skin is warm and dry.     Capillary Refill: Capillary refill takes less than 2 seconds.  Neurological:     General: No focal deficit present.     Mental Status: He is alert and oriented to person, place, and time.  Psychiatric:        Mood and Affect: Mood normal.        Behavior: Behavior normal.        Thought Content: Thought content normal.        Judgment: Judgment normal.     Results for orders placed or performed in visit on 12/23/21  HIV Antibody (routine testing w rflx)  Result Value Ref Range   HIV Screen 4th Generation wRfx Non Reactive Non  Reactive  Hepatitis C Antibody  Result Value Ref Range   Hep C Virus Ab Non Reactive Non Reactive  TSH  Result Value Ref Range   TSH 1.160 0.450 - 4.500 uIU/mL  PSA  Result Value Ref Range   Prostate Specific Ag, Serum 0.5 0.0 - 4.0 ng/mL  Lipid panel  Result Value Ref Range   Cholesterol, Total 175 100 - 199 mg/dL   Triglycerides 161 (H) 0 - 149 mg/dL   HDL 24 (L) >09 mg/dL   VLDL Cholesterol Cal 41 (H) 5 - 40 mg/dL   LDL Chol Calc (NIH) 604 (H) 0 - 99 mg/dL   Chol/HDL Ratio 7.3 (H) 0.0 - 5.0 ratio  CBC with Differential/Platelet  Result Value Ref Range   WBC 10.8 3.4 - 10.8 x10E3/uL   RBC 5.08 4.14 - 5.80 x10E6/uL   Hemoglobin 15.7 13.0 - 17.7 g/dL   Hematocrit 54.0 98.1 - 51.0 %   MCV 89 79 - 97 fL   MCH 30.9 26.6 - 33.0 pg   MCHC 34.9 31.5 - 35.7 g/dL   RDW 19.1 47.8 - 29.5 %   Platelets 231 150 - 450 x10E3/uL   Neutrophils 64 Not Estab. %   Lymphs 28 Not Estab. %   Monocytes 5 Not Estab. %   Eos 2 Not Estab. %   Basos 1 Not Estab. %   Neutrophils Absolute 6.9 1.4 - 7.0 x10E3/uL   Lymphocytes Absolute 3.0 0.7 - 3.1 x10E3/uL   Monocytes Absolute 0.6 0.1 - 0.9 x10E3/uL   EOS (ABSOLUTE) 0.2 0.0 - 0.4 x10E3/uL   Basophils Absolute 0.1 0.0 - 0.2 x10E3/uL   Immature Granulocytes 0 Not Estab. %   Immature Grans (Abs) 0.0 0.0 - 0.1 x10E3/uL  Comprehensive metabolic panel  Result Value Ref Range   Glucose 96 70 - 99 mg/dL   BUN 10 6 - 24 mg/dL   Creatinine, Ser 6.21 0.76 - 1.27 mg/dL   eGFR 89 >30 QM/VHQ/4.69   BUN/Creatinine Ratio 10 9 - 20   Sodium 136 134 - 144 mmol/L   Potassium 4.3 3.5 - 5.2 mmol/L   Chloride 100 96 - 106 mmol/L   CO2 21 20 - 29 mmol/L   Calcium 9.2 8.7 - 10.2 mg/dL   Total Protein 6.9 6.0 - 8.5 g/dL   Albumin 4.1 3.8 - 4.9 g/dL   Globulin, Total 2.8 1.5 - 4.5 g/dL   Albumin/Globulin Ratio 1.5 1.2 - 2.2   Bilirubin Total 0.6 0.0 - 1.2 mg/dL   Alkaline Phosphatase 84 44 - 121 IU/L   AST 22 0 -  40 IU/L   ALT 27 0 - 44 IU/L  Urinalysis,  Routine w reflex microscopic  Result Value Ref Range   Specific Gravity, UA <1.005 (L) 1.005 - 1.030   pH, UA 6.0 5.0 - 7.5   Color, UA Yellow Yellow   Appearance Ur Clear Clear   Leukocytes,UA Negative Negative   Protein,UA Negative Negative/Trace   Glucose, UA Negative Negative   Ketones, UA Negative Negative   RBC, UA Negative Negative   Bilirubin, UA Negative Negative   Urobilinogen, Ur 0.2 0.2 - 1.0 mg/dL   Nitrite, UA Negative Negative   Microscopic Examination Comment       Assessment & Plan:   Problem List Items Addressed This Visit       Respiratory   Chronic obstructive pulmonary disease (HCC) - Primary    Chronic.  Controlled.  Continue with current medication regimen of Breo and Albuterol PRN.  Refills sent today.  Labs ordered today.  Return to clinic in 6 months for reevaluation.  Call sooner if concerns arise.        Relevant Medications   albuterol (VENTOLIN HFA) 108 (90 Base) MCG/ACT inhaler   Other Relevant Orders   Comp Met (CMET)     Other   Elevated blood pressure reading    Labs ordered at visit today.  Will make recommendations based on lab results.        Hypercholesterolemia    Labs ordered at visit today.  Will make recommendations based on lab results.        Relevant Medications   tadalafil (CIALIS) 20 MG tablet   Other Relevant Orders   Lipid Profile     Follow up plan: Return in about 6 months (around 12/15/2022) for Physical and Fasting labs.

## 2022-06-15 LAB — COMPREHENSIVE METABOLIC PANEL
ALT: 22 IU/L (ref 0–44)
AST: 21 IU/L (ref 0–40)
Albumin/Globulin Ratio: 1.5 (ref 1.2–2.2)
Albumin: 4.4 g/dL (ref 3.8–4.9)
Alkaline Phosphatase: 94 IU/L (ref 44–121)
BUN/Creatinine Ratio: 7 — ABNORMAL LOW (ref 9–20)
BUN: 7 mg/dL (ref 6–24)
Bilirubin Total: 0.5 mg/dL (ref 0.0–1.2)
CO2: 21 mmol/L (ref 20–29)
Calcium: 9.6 mg/dL (ref 8.7–10.2)
Chloride: 101 mmol/L (ref 96–106)
Creatinine, Ser: 0.99 mg/dL (ref 0.76–1.27)
Globulin, Total: 2.9 g/dL (ref 1.5–4.5)
Glucose: 76 mg/dL (ref 70–99)
Potassium: 4.3 mmol/L (ref 3.5–5.2)
Sodium: 137 mmol/L (ref 134–144)
Total Protein: 7.3 g/dL (ref 6.0–8.5)
eGFR: 92 mL/min/{1.73_m2} (ref >59)

## 2022-06-15 LAB — LIPID PANEL
Chol/HDL Ratio: 7 ratio — ABNORMAL HIGH (ref 0.0–5.0)
Cholesterol, Total: 168 mg/dL (ref 100–199)
HDL: 24 mg/dL — ABNORMAL LOW (ref >39)
LDL Chol Calc (NIH): 98 mg/dL (ref 0–99)
Triglycerides: 268 mg/dL — ABNORMAL HIGH (ref 0–149)
VLDL Cholesterol Cal: 46 mg/dL — ABNORMAL HIGH (ref 5–40)

## 2022-06-15 NOTE — Progress Notes (Signed)
Please let patient know that his cholesterol is elevated.  His cardiac risk score puts him at high risk of having a stroke or heart attack over the next 10 years.  I recommend that he start a statin called crestor 5mg  daily.  The goal will be to increase this to 20mg  daily if patient tolerates it well.  If he agrees to the medication I can send it to the pharmacy.     The 10-year ASCVD risk score (Arnett DK, et al., 2019) is: 11.9%   Values used to calculate the score:     Age: 52 years     Sex: Male     Is Non-Hispanic African American: No     Diabetic: No     Tobacco smoker: Yes     Systolic Blood Pressure: 116 mmHg     Is BP treated: No     HDL Cholesterol: 24 mg/dL     Total Cholesterol: 168 mg/dL

## 2022-06-21 ENCOUNTER — Telehealth: Payer: Self-pay | Admitting: Nurse Practitioner

## 2022-06-21 MED ORDER — ROSUVASTATIN CALCIUM 5 MG PO TABS: 5.0000 mg | ORAL_TABLET | Freq: Every day | ORAL | 1 refills | Status: AC

## 2022-06-21 NOTE — Telephone Encounter (Signed)
Medication sent to the pharmacy.

## 2022-06-21 NOTE — Telephone Encounter (Signed)
The patient called in stating he received a phone call from a nurse asking if he would take Crestor based on recent labs that were done. After reviewing what his insurance will cover he has decided after finding out they will cover the generic form Rosuvastatin to get a prescription for this called into his drug store.  [facility]     [id number] [place] - [address] Phone: [phone]  Fax: [phone]     Please assist patient further.

## 2022-06-21 NOTE — Telephone Encounter (Signed)
Patient notified that medication was sent in for him

## 2022-06-23 ENCOUNTER — Ambulatory Visit: Payer: 59 | Admitting: Nurse Practitioner

## 2022-06-27 ENCOUNTER — Other Ambulatory Visit: Payer: Self-pay | Admitting: Nurse Practitioner

## 2022-06-28 NOTE — Telephone Encounter (Signed)
Requested Prescriptions  Pending Prescriptions Disp Refills   BREO ELLIPTA 200-25 MCG/ACT AEPB [Pharmacy Med Name: Earlie Server 098-11 MCG/INH Inhalation Aerosol Powder Breath Activated] 180 each 1    Sig: USE 1 INHALATION BY MOUTH DAILY     Pulmonology:  Combination Products Passed - 06/27/2022 10:19 PM      Passed - Valid encounter within last 12 months    Recent Outpatient Visits           2 weeks ago Chronic obstructive pulmonary disease, unspecified COPD type (HCC)   [place] [facility] Larae Grooms, NP   6 months ago Annual physical exam   [place] [facility], NP   7 months ago Chronic obstructive pulmonary disease, unspecified COPD type [facility])   [place] [facility] Larae Grooms, NP   7 years ago Preventative health care   [place] [facility] Lada, Janit Bern, MD   7 years ago Bronchitis   [place] [facility] Lada, Janit Bern, MD       Future Appointments             In 6 months Larae Grooms, NP [place] [facility], [facility]

## 2022-10-07 ENCOUNTER — Other Ambulatory Visit: Payer: Self-pay | Admitting: Nurse Practitioner

## 2022-10-07 NOTE — Telephone Encounter (Signed)
Requested Prescriptions  Pending Prescriptions Disp Refills   tadalafil (CIALIS) 20 MG tablet [Pharmacy Med Name: Tadalafil 20 MG Oral Tablet] 10 tablet 0    Sig: TAKE 1 TABLET BY MOUTH AS NEEDED FOR ERECTILE DYSFUNCTION     Urology: Erectile Dysfunction Agents Passed - 10/07/2022  5:59 AM      Passed - AST in normal range and within 360 days    AST  Date Value Ref Range Status  06/14/2022 21 0 - 40 IU/L Final         Passed - ALT in normal range and within 360 days    ALT  Date Value Ref Range Status  06/14/2022 22 0 - 44 IU/L Final         Passed - Last BP in normal range    BP Readings from Last 1 Encounters:  06/14/22 116/77         Passed - Valid encounter within last 12 months    Recent Outpatient Visits           3 months ago Chronic obstructive pulmonary disease, unspecified COPD type (HCC)   [place] [facility] Larae Grooms, NP   9 months ago Annual physical exam   [place] [facility] Larae Grooms, NP   11 months ago Chronic obstructive pulmonary disease, unspecified COPD type [facility])   [place] [facility] Larae Grooms, NP   8 years ago Preventative health care   [place] [facility] Lada, Janit Bern, MD   8 years ago Bronchitis   [place] [facility] Lada, Janit Bern, MD       Future Appointments             In 2 months Larae Grooms, NP [place] [facility], [facility]

## 2022-12-05 ENCOUNTER — Other Ambulatory Visit: Payer: Self-pay | Admitting: Nurse Practitioner

## 2022-12-05 NOTE — Telephone Encounter (Signed)
Medication Refill -  Most Recent Primary Care Visit:  Provider: Larae Grooms  Department: [facility]  Visit Type: OFFICE VISIT  Date: 06/14/2022  Medication: tadalafil (CIALIS) 20 MG tablet , albuterol (VENTOLIN HFA) 108 (90 Base) MCG/ACT inhaler   Has the patient contacted their pharmacy? No (Agent: If no, request that the patient contact the pharmacy for the refill. If patient does not wish to contact the pharmacy document the reason why and proceed with request.) (Agent: If yes, when and what did the pharmacy advise?)  Is this the correct pharmacy for this prescription? No If no, delete pharmacy and type the correct one.  This is the patient's preferred pharmacy:   [facility] [address] [place], [place] - [phone] Sanjuana Kava Phone: [phone]  Fax: [phone]       Has the prescription been filled recently? No  Is the patient out of the medication? Yes  Has the patient been seen for an appointment in the last year OR does the patient have an upcoming appointment? Yes  Can we respond through MyChart? No  Please assist patient further as he is completely out of his Tadalafil.

## 2022-12-06 MED ORDER — ALBUTEROL SULFATE HFA 108 (90 BASE) MCG/ACT IN AERS: 2.0000 | INHALATION_SPRAY | Freq: Four times a day (QID) | RESPIRATORY_TRACT | 0 refills | Status: AC | PRN

## 2022-12-06 MED ORDER — TADALAFIL 20 MG PO TABS: 20.0000 mg | ORAL_TABLET | Freq: Every day | ORAL | 0 refills | Status: AC | PRN

## 2022-12-06 NOTE — Telephone Encounter (Signed)
Requested Prescriptions  Pending Prescriptions Disp Refills   tadalafil (CIALIS) 20 MG tablet 10 tablet 0    Sig: Take 1 tablet (20 mg total) by mouth daily as needed for erectile dysfunction.     Urology: Erectile Dysfunction Agents Passed - 12/05/2022 11:13 AM      Passed - AST in normal range and within 360 days    AST  Date Value Ref Range Status  06/14/2022 21 0 - 40 IU/L Final         Passed - ALT in normal range and within 360 days    ALT  Date Value Ref Range Status  06/14/2022 22 0 - 44 IU/L Final         Passed - Last BP in normal range    BP Readings from Last 1 Encounters:  06/14/22 116/77         Passed - Valid encounter within last 12 months    Recent Outpatient Visits           5 months ago Chronic obstructive pulmonary disease, unspecified COPD type (HCC)   [place] [facility], [facility]   11 months ago Annual physical exam   [place] [facility] Larae Grooms, NP   1 year ago Chronic obstructive pulmonary disease, unspecified COPD type (HCC)   [place] [facility] Larae Grooms, NP   8 years ago Preventative health care   [place] [facility] Lada, Janit Bern, MD   8 years ago Bronchitis   [place] [facility] Lada, Janit Bern, MD       Future Appointments             In 3 weeks Larae Grooms, NP [place] [facility], [facility]             albuterol (VENTOLIN HFA) 108 (90 Base) MCG/ACT inhaler 18 g 0    Sig: Inhale 2 puffs into the lungs every 6 (six) hours as needed for wheezing or shortness of breath.     Pulmonology:  Beta Agonists 2 Passed - 12/05/2022 11:13 AM      Passed - Last BP in normal range    BP Readings from Last 1 Encounters:  06/14/22 116/77         Passed - Last Heart Rate in normal range    Pulse Readings from Last 1 Encounters:  06/14/22 93         Passed - Valid encounter within last 12 months    Recent  Outpatient Visits           5 months ago Chronic obstructive pulmonary disease, unspecified COPD type (HCC)   [place] [facility] Larae Grooms, NP   11 months ago Annual physical exam   [place] [facility] Larae Grooms, NP   1 year ago Chronic obstructive pulmonary disease, unspecified COPD type [facility])   [place] [facility] Larae Grooms, NP   8 years ago Preventative health care   [place] [facility] Lada, Janit Bern, MD   8 years ago Bronchitis   [place] [facility] Lada, Janit Bern, MD       Future Appointments             In 3 weeks Larae Grooms, NP  [facility], [facility]

## 2022-12-28 NOTE — Progress Notes (Unsigned)
There were no vitals taken for this visit.   Subjective:    Patient ID: Luis Buck, male    DOB: 02/19/1970, 52 y.o.   MRN: [id number]  HPI: Luis Buck is a 52 y.o. male presenting on 12/29/2022 for comprehensive medical examination. Current medical complaints include:none  He currently lives with: Interim Problems from his last visit: no  ELEVATED BLOOD PRESSURE Duration of elevated BP: unknown BP monitoring frequency: not checking BP range:  Previous BP meds: no Recent stressors: no Family history of hypertension: no Recurrent headaches: no Visual changes: no Palpitations: no  Dyspnea: yes Chest pain: no Lower extremity edema: no Dizzy/lightheaded: no Transient ischemic attacks: no  HYPERLIPIDEMIA Hyperlipidemia status: excellent compliance Satisfied with current treatment?  yes Side effects:  no Medication compliance: excellent compliance Past cholesterol meds: none Supplements: none Aspirin:  no The 10-year ASCVD risk score (Arnett DK, et al., 2019) is: 11.9%   Values used to calculate the score:     Age: 62 years     Sex: Male     Is Non-Hispanic African American: No     Diabetic: No     Tobacco smoker: Yes     Systolic Blood Pressure: 116 mmHg     Is BP treated: No     HDL Cholesterol: 24 mg/dL     Total Cholesterol: 168 mg/dL Chest pain:  no Coronary artery disease:  no Family history CAD:  no Family history early CAD:  no   Depression Screen done today and results listed below:     06/14/2022    1:44 PM 12/23/2021   10:28 AM  Depression screen PHQ 2/9  Decreased Interest 0 0  Down, Depressed, Hopeless 0 0  PHQ - 2 Score 0 0  Altered sleeping 0 2  Tired, decreased energy 1 1  Change in appetite 0 1  Feeling bad or failure about yourself  0 0  Trouble concentrating 0 0  Moving slowly or fidgety/restless 0 0  Suicidal thoughts 0 0  PHQ-9 Score 1 4  Difficult doing work/chores Not difficult at all     The patient does not have a  history of falls. I did complete a risk assessment for falls. A plan of care for falls was documented.   Past Medical History:  Past Medical History:  Diagnosis Date   Arthritis    COPD (chronic obstructive pulmonary disease) (HCC)    Finger numbness    right hand.  S/P pinched nerve damage   Hypogonadism in male    Neck stiffness    limited movement due to plate in neck   Tobacco abuse    Wears hearing aid in both ears     Surgical History:  Past Surgical History:  Procedure Laterality Date   BACK SURGERY  2000   C6-7 fused.  [facility] [place].  [place]   COLONOSCOPY WITH PROPOFOL N/A 02/03/2022   Procedure: COLONOSCOPY WITH PROPOFOL;  Surgeon: Midge Minium, MD;  Location: [facility];  Service: Endoscopy;  Laterality: N/A;   SHOULDER SURGERY     "cleaned out"    Medications:  Current Outpatient Medications on File Prior to Visit  Medication Sig   albuterol (VENTOLIN HFA) 108 (90 Base) MCG/ACT inhaler Inhale 2 puffs into the lungs every 6 (six) hours as needed for wheezing or shortness of breath.   fluticasone furoate-vilanterol (BREO ELLIPTA) 200-25 MCG/ACT AEPB USE 1 INHALATION BY MOUTH DAILY   Multiple Vitamin (MULTIVITAMIN) tablet Take 1 tablet  by mouth daily.   rosuvastatin (CRESTOR) 5 MG tablet Take 1 tablet (5 mg total) by mouth daily.   tadalafil (CIALIS) 20 MG tablet Take 1 tablet (20 mg total) by mouth daily as needed for erectile dysfunction.   No current facility-administered medications on file prior to visit.    Allergies:  No Known Allergies  Social History:  Social History   Socioeconomic History   Marital status: Married    Spouse name: Not on file   Number of children: Not on file   Years of education: Not on file   Highest education level: Not on file  Occupational History   Not on file  Tobacco Use   Smoking status: Every Day    Current packs/day: 1.00    Types: Cigarettes   Smokeless tobacco: Former    Types: Chew    Quit  date: 01/25/2004   Tobacco comments:    01/26/22 - currently down to 4-5 cigs/day.  Vaping Use   Vaping status: Never Used  Substance and Sexual Activity   Alcohol use: Yes    Comment: occasional   Drug use: No   Sexual activity: Yes  Other Topics Concern   Not on file  Social History Narrative   ** Merged History Encounter **       Social Determinants of Health   Financial Resource Strain: Not on file  Food Insecurity: Not on file  Transportation Needs: Not on file  Physical Activity: Not on file  Stress: Not on file  Social Connections: Unknown (05/31/2022)   [facility] from [facility]   Social Network    Social Network: Not on file  Intimate Partner Violence: Unknown (05/31/2022)   [facility]   HITS    Physically Hurt: Not on file    Insult or Talk Down To: Not on file    Threaten Physical Harm: Not on file    Scream or Curse: Not on file   Social History   Tobacco Use  Smoking Status Every Day   Current packs/day: 1.00   Types: Cigarettes  Smokeless Tobacco Former   Types: Chew   Quit date: 01/25/2004  Tobacco Comments   01/26/22 - currently down to 4-5 cigs/day.   Social History   Substance and Sexual Activity  Alcohol Use Yes   Comment: occasional    Family History:  Family History  Problem Relation Age of Onset   Cancer Mother        lung, brain   Hypertension Father    Heart disease Maternal Grandfather     Past medical history, surgical history, medications, allergies, family history and social history reviewed with patient today and changes made to appropriate areas of the chart.   Review of Systems  Eyes:  Negative for blurred vision and double vision.  Respiratory:  Positive for shortness of breath.   Cardiovascular:  Negative for chest pain, palpitations and leg swelling.  Neurological:  Negative for dizziness and headaches.   All other ROS negative except what is listed above and in the HPI.       Objective:    There were no vitals taken for this visit.  Wt Readings from Last 3 Encounters:  06/14/22 243 lb 9.6 oz (110.5 kg)  02/03/22 234 lb 11.2 oz (106.5 kg)  12/23/21 239 lb 12.8 oz (108.8 kg)    Physical Exam Vitals and nursing note reviewed.  Constitutional:      General: He is not in  acute distress.    Appearance: Normal appearance. He is obese. He is not ill-appearing, toxic-appearing or diaphoretic.  HENT:     Head: Normocephalic.     Right Ear: Tympanic membrane, ear canal and external ear normal.     Left Ear: Tympanic membrane, ear canal and external ear normal.     Nose: Nose normal. No congestion or rhinorrhea.     Mouth/Throat:     Mouth: Mucous membranes are moist.  Eyes:     General:        Right eye: No discharge.        Left eye: No discharge.     Extraocular Movements: Extraocular movements intact.     Conjunctiva/sclera: Conjunctivae normal.     Pupils: Pupils are equal, round, and reactive to light.  Cardiovascular:     Rate and Rhythm: Normal rate and regular rhythm.     Heart sounds: No murmur heard. Pulmonary:     Effort: Pulmonary effort is normal. No respiratory distress.     Breath sounds: Normal breath sounds. No wheezing, rhonchi or rales.  Abdominal:     General: Abdomen is flat. Bowel sounds are normal. There is no distension.     Palpations: Abdomen is soft.     Tenderness: There is no abdominal tenderness. There is no guarding.  Musculoskeletal:     Cervical back: Normal range of motion and neck supple.  Skin:    General: Skin is warm and dry.     Capillary Refill: Capillary refill takes less than 2 seconds.  Neurological:     General: No focal deficit present.     Mental Status: He is alert and oriented to person, place, and time.     Cranial Nerves: No cranial nerve deficit.     Motor: No weakness.     Deep Tendon Reflexes: Reflexes normal.  Psychiatric:        Mood and Affect: Mood normal.        Behavior: Behavior normal.         Thought Content: Thought content normal.        Judgment: Judgment normal.     Results for orders placed or performed in visit on 06/14/22  Comp Met (CMET)  Result Value Ref Range   Glucose 76 70 - 99 mg/dL   BUN 7 6 - 24 mg/dL   Creatinine, Ser 8.41 0.76 - 1.27 mg/dL   eGFR 92 >32 GM/WNU/2.72   BUN/Creatinine Ratio 7 (L) 9 - 20   Sodium 137 134 - 144 mmol/L   Potassium 4.3 3.5 - 5.2 mmol/L   Chloride 101 96 - 106 mmol/L   CO2 21 20 - 29 mmol/L   Calcium 9.6 8.7 - 10.2 mg/dL   Total Protein 7.3 6.0 - 8.5 g/dL   Albumin 4.4 3.8 - 4.9 g/dL   Globulin, Total 2.9 1.5 - 4.5 g/dL   Albumin/Globulin Ratio 1.5 1.2 - 2.2   Bilirubin Total 0.5 0.0 - 1.2 mg/dL   Alkaline Phosphatase 94 44 - 121 IU/L   AST 21 0 - 40 IU/L   ALT 22 0 - 44 IU/L  Lipid Profile  Result Value Ref Range   Cholesterol, Total 168 100 - 199 mg/dL   Triglycerides 536 (H) 0 - 149 mg/dL   HDL 24 (L) >64 mg/dL   VLDL Cholesterol Cal 46 (H) 5 - 40 mg/dL   LDL Chol Calc (NIH) 98 0 - 99 mg/dL   Chol/HDL Ratio 7.0 (H) 0.0 -  5.0 ratio      Assessment & Plan:   Problem List Items Addressed This Visit       Respiratory   Chronic obstructive pulmonary disease (HCC) - Primary     Other   Elevated blood pressure reading   Hypercholesterolemia     Discussed aspirin prophylaxis for myocardial infarction prevention and decision was it was not indicated  LABORATORY TESTING:  Health maintenance labs ordered today as discussed above.   The natural history of prostate cancer and ongoing controversy regarding screening and potential treatment outcomes of prostate cancer has been discussed with the patient. The meaning of a false positive PSA and a false negative PSA has been discussed. He indicates understanding of the limitations of this screening test and wishes to proceed with screening PSA testing.   IMMUNIZATIONS:   - Tdap: Tetanus vaccination status reviewed: last tetanus booster within 10 years. -  Influenza: Refused - Pneumovax: Not applicable - Prevnar: Not applicable - COVID: Not applicable - HPV: Not applicable - Shingrix vaccine: Administered today  SCREENING: - Colonoscopy: Ordered today  Discussed with patient purpose of the colonoscopy is to detect colon cancer at curable precancerous or early stages   - AAA Screening: Not applicable  -Hearing Test: Not applicable  -Spirometry: Not applicable   PATIENT COUNSELING:    Sexuality: Discussed sexually transmitted diseases, partner selection, use of condoms, avoidance of unintended pregnancy  and contraceptive alternatives.   Advised to avoid cigarette smoking.  I discussed with the patient that most people either abstain from alcohol or drink within safe limits (<=14/week and <=4 drinks/occasion for males, <=7/weeks and <= 3 drinks/occasion for females) and that the risk for alcohol disorders and other health effects rises proportionally with the number of drinks per week and how often a drinker exceeds daily limits.  Discussed cessation/primary prevention of drug use and availability of treatment for abuse.   Diet: Encouraged to adjust caloric intake to maintain  or achieve ideal body weight, to reduce intake of dietary saturated fat and total fat, to limit sodium intake by avoiding high sodium foods and not adding table salt, and to maintain adequate dietary potassium and calcium preferably from fresh fruits, vegetables, and low-fat dairy products.    stressed the importance of regular exercise  Injury prevention: Discussed safety belts, safety helmets, smoke detector, smoking near bedding or upholstery.   Dental health: Discussed importance of regular tooth brushing, flossing, and dental visits.   Follow up plan: NEXT PREVENTATIVE PHYSICAL DUE IN 1 YEAR. No follow-ups on file.

## 2022-12-29 ENCOUNTER — Encounter: Payer: Self-pay | Admitting: Nurse Practitioner

## 2022-12-29 DIAGNOSIS — R030 Elevated blood-pressure reading, without diagnosis of hypertension: Secondary | ICD-10-CM

## 2022-12-29 DIAGNOSIS — J449 Chronic obstructive pulmonary disease, unspecified: Secondary | ICD-10-CM

## 2022-12-29 DIAGNOSIS — E7800 Pure hypercholesterolemia, unspecified: Secondary | ICD-10-CM

## 2022-12-29 DIAGNOSIS — Z0000 Encounter for general adult medical examination without abnormal findings: Secondary | ICD-10-CM

## 2023-07-17 ENCOUNTER — Telehealth: Payer: Self-pay | Admitting: Nurse Practitioner

## 2023-07-17 NOTE — Telephone Encounter (Unsigned)
 Copied from CRM [id number]. Topic: Medical Record Request - Provider/Facility Request >> Jul 17, 2023 10:44 AM Tiffini S wrote: Reason for CRM:  Melissa RN with [facility] [phone] called asking for all medical records to be faxed for notes, labs today  faxed to: [phone] for a appointment scheduled today on 07/17/23.
